# Patient Record
Sex: Male | Born: 1960 | Race: Black or African American | Hispanic: No | Marital: Married | Smoking: Current some day smoker
Health system: Southern US, Community
[De-identification: ages and names within clinical notes are randomized; demographics above are authoritative.]

## PROBLEM LIST (undated history)

## (undated) DIAGNOSIS — M1990 Unspecified osteoarthritis, unspecified site: Secondary | ICD-10-CM

## (undated) DIAGNOSIS — R760 Raised antibody titer: Secondary | ICD-10-CM

## (undated) DIAGNOSIS — R002 Palpitations: Secondary | ICD-10-CM

## (undated) DIAGNOSIS — R791 Abnormal coagulation profile: Secondary | ICD-10-CM

## (undated) HISTORY — DX: Palpitations: R00.2

## (undated) HISTORY — DX: Unspecified osteoarthritis, unspecified site: M19.90

## (undated) HISTORY — PX: OTHER SURGICAL HISTORY: SHX169

## (undated) HISTORY — DX: Raised antibody titer: R76.0

## (undated) HISTORY — DX: Abnormal coagulation profile: R79.1

---

## 2000-09-20 ENCOUNTER — Emergency Department (HOSPITAL_COMMUNITY): Admission: EM | Admit: 2000-09-20 | Discharge: 2000-09-20 | Payer: Self-pay | Admitting: Emergency Medicine

## 2000-09-20 ENCOUNTER — Encounter: Payer: Self-pay | Admitting: Emergency Medicine

## 2003-08-05 ENCOUNTER — Emergency Department (HOSPITAL_COMMUNITY): Admission: EM | Admit: 2003-08-05 | Discharge: 2003-08-05 | Payer: Self-pay | Admitting: *Deleted

## 2005-07-07 ENCOUNTER — Emergency Department (HOSPITAL_COMMUNITY): Admission: EM | Admit: 2005-07-07 | Discharge: 2005-07-07 | Payer: Self-pay | Admitting: Family Medicine

## 2005-08-12 ENCOUNTER — Emergency Department (HOSPITAL_COMMUNITY): Admission: EM | Admit: 2005-08-12 | Discharge: 2005-08-12 | Payer: Self-pay | Admitting: Family Medicine

## 2006-01-30 ENCOUNTER — Ambulatory Visit: Payer: Self-pay | Admitting: Internal Medicine

## 2006-01-31 ENCOUNTER — Ambulatory Visit: Payer: Self-pay | Admitting: Internal Medicine

## 2006-06-30 ENCOUNTER — Ambulatory Visit: Payer: Self-pay | Admitting: Internal Medicine

## 2006-12-04 ENCOUNTER — Ambulatory Visit: Payer: Self-pay | Admitting: Internal Medicine

## 2006-12-04 LAB — CONVERTED CEMR LAB
BUN: 11 mg/dL (ref 6–23)
CO2: 28 meq/L (ref 19–32)
Calcium: 9.4 mg/dL (ref 8.4–10.5)
Chloride: 102 meq/L (ref 96–112)
Creatinine, Ser: 1 mg/dL (ref 0.4–1.5)
GFR calc Af Amer: 103 mL/min
GFR calc non Af Amer: 85 mL/min
Glucose, Bld: 100 mg/dL — ABNORMAL HIGH (ref 70–99)
Hgb A1c MFr Bld: 6.1 % — ABNORMAL HIGH (ref 4.6–6.0)
Potassium: 4 meq/L (ref 3.5–5.1)
Sodium: 138 meq/L (ref 135–145)

## 2007-02-05 ENCOUNTER — Ambulatory Visit: Payer: Self-pay | Admitting: Internal Medicine

## 2008-06-16 ENCOUNTER — Encounter: Payer: Self-pay | Admitting: Internal Medicine

## 2008-06-23 ENCOUNTER — Ambulatory Visit: Payer: Self-pay | Admitting: Hematology

## 2008-06-23 ENCOUNTER — Encounter: Payer: Self-pay | Admitting: Internal Medicine

## 2008-06-23 LAB — PROTHROMBIN TIME
INR: 0.8 (ref 0.0–1.5)
Prothrombin Time: 11.6 seconds (ref 11.6–15.2)

## 2008-06-26 LAB — MIXING STUDY DILUTIONS, PTT
Patient 1/1 Immediate Mix: 54 seconds
Patient 4/1 Immediate Mix: 58 seconds

## 2008-06-26 LAB — COMPREHENSIVE METABOLIC PANEL
ALT: 22 U/L (ref 0–53)
AST: 17 U/L (ref 0–37)
BUN: 14 mg/dL (ref 6–23)
Calcium: 10.3 mg/dL (ref 8.4–10.5)
Chloride: 105 mEq/L (ref 96–112)
Creatinine, Ser: 1.25 mg/dL (ref 0.40–1.50)
Total Bilirubin: 1.1 mg/dL (ref 0.3–1.2)

## 2008-06-26 LAB — LUPUS ANTICOAGULANT PANEL
DRVVT 1:1 Mix: 38.2 secs (ref 36.1–47.0)
DRVVT: 56.5 secs — ABNORMAL HIGH (ref 36.1–47.0)
PTTLA Confirmation: 20.9 secs — ABNORMAL HIGH (ref <8.0)

## 2008-06-26 LAB — CARDIOLIPIN ANTIBODIES, IGG, IGM, IGA: Anticardiolipin IgG: <7 [GPL'U] (ref <11)

## 2008-06-26 LAB — BETA-2 GLYCOPROTEIN ANTIBODIES
Beta-2-Glycoprotein I IgA: <4 U/mL (ref <10)
Beta-2-Glycoprotein I IgM: <4 U/mL (ref <10)

## 2008-07-03 ENCOUNTER — Telehealth: Payer: Self-pay | Admitting: Internal Medicine

## 2008-07-09 ENCOUNTER — Telehealth: Payer: Self-pay | Admitting: Internal Medicine

## 2008-07-09 ENCOUNTER — Encounter: Payer: Self-pay | Admitting: Internal Medicine

## 2008-07-09 DIAGNOSIS — I2510 Atherosclerotic heart disease of native coronary artery without angina pectoris: Secondary | ICD-10-CM | POA: Insufficient documentation

## 2008-07-09 DIAGNOSIS — F17200 Nicotine dependence, unspecified, uncomplicated: Secondary | ICD-10-CM | POA: Insufficient documentation

## 2008-07-09 DIAGNOSIS — D6859 Other primary thrombophilia: Secondary | ICD-10-CM | POA: Insufficient documentation

## 2008-07-30 ENCOUNTER — Encounter: Payer: Self-pay | Admitting: Internal Medicine

## 2008-08-12 ENCOUNTER — Ambulatory Visit: Payer: Self-pay | Admitting: Internal Medicine

## 2008-11-10 ENCOUNTER — Ambulatory Visit: Payer: Self-pay | Admitting: Internal Medicine

## 2008-11-10 LAB — CONVERTED CEMR LAB
ALT: 22 units/L (ref 0–53)
AST: 21 units/L (ref 0–37)
Albumin: 3.9 g/dL (ref 3.5–5.2)
Alkaline Phosphatase: 50 units/L (ref 39–117)
BUN: 12 mg/dL (ref 6–23)
Bilirubin, Direct: 0.1 mg/dL (ref 0.0–0.3)
CO2: 31 meq/L (ref 19–32)
CRP, High Sensitivity: <1 — ABNORMAL LOW (ref 0.00–5.00)
Calcium: 9.4 mg/dL (ref 8.4–10.5)
Chloride: 108 meq/L (ref 96–112)
Creatinine, Ser: 1.2 mg/dL (ref 0.4–1.5)
GFR calc Af Amer: 83 mL/min
GFR calc non Af Amer: 68 mL/min
Glucose, Bld: 120 mg/dL — ABNORMAL HIGH (ref 70–99)
Potassium: 4.1 meq/L (ref 3.5–5.1)
Sodium: 143 meq/L (ref 135–145)
Total Bilirubin: 1.3 mg/dL — ABNORMAL HIGH (ref 0.3–1.2)
Total Protein: 7 g/dL (ref 6.0–8.3)

## 2008-12-16 ENCOUNTER — Ambulatory Visit: Payer: Self-pay | Admitting: Internal Medicine

## 2009-01-01 ENCOUNTER — Telehealth: Payer: Self-pay | Admitting: Internal Medicine

## 2009-01-09 ENCOUNTER — Ambulatory Visit: Payer: Self-pay | Admitting: Internal Medicine

## 2009-01-19 ENCOUNTER — Telehealth: Payer: Self-pay | Admitting: Internal Medicine

## 2009-03-09 ENCOUNTER — Ambulatory Visit: Payer: Self-pay | Admitting: Oncology

## 2009-05-08 ENCOUNTER — Ambulatory Visit: Payer: Self-pay | Admitting: Internal Medicine

## 2009-05-15 ENCOUNTER — Ambulatory Visit: Payer: Self-pay | Admitting: Internal Medicine

## 2009-05-15 LAB — CONVERTED CEMR LAB
Cholesterol: 209 mg/dL — ABNORMAL HIGH (ref 0–200)
Direct LDL: 101.8 mg/dL
HDL: 84.9 mg/dL (ref >39.00)
Hgb A1c MFr Bld: 5.8 % (ref 4.6–6.5)
PSA: 0.41 ng/mL (ref 0.10–4.00)
Total CHOL/HDL Ratio: 2
Triglycerides: 100 mg/dL (ref 0.0–149.0)
VLDL: 20 mg/dL (ref 0.0–40.0)

## 2009-05-15 LAB — HM COLONOSCOPY

## 2009-06-18 ENCOUNTER — Ambulatory Visit: Payer: Self-pay | Admitting: Internal Medicine

## 2009-06-18 DIAGNOSIS — R7309 Other abnormal glucose: Secondary | ICD-10-CM | POA: Insufficient documentation

## 2009-11-18 ENCOUNTER — Telehealth: Payer: Self-pay | Admitting: Internal Medicine

## 2010-10-27 ENCOUNTER — Telehealth: Payer: Self-pay | Admitting: Internal Medicine

## 2010-11-23 NOTE — Progress Notes (Signed)
Summary: Copy of lab & office visit  Phone Note Call from Patient Call back at Home Phone [phone]   Caller: Patient Summary of Call: patient called and left voice message requesting a copy of his lab work and physical. His message states that he is in the process of completing an online assessment for his insurance and need numbers from the lab work and his vital signs taken.  Copy of lab work and office visit was mailed to patient address on file, patient has been informed Initial call taken by: Glendell Docker CMA,  November 18, 2009 2:58 PM

## 2010-11-24 ENCOUNTER — Telehealth: Payer: Self-pay | Admitting: Internal Medicine

## 2010-11-25 NOTE — Progress Notes (Signed)
Summary: Chantix Denial  Phone Note Refill Request Message from:  Pharmacy on October 27, 2010 11:45 AM  Refills Requested: Medication #1:  chantix   Brand Name Necessary? Yes   Supply Requested: [facility] month [facility] [address] [place] [address] fax [phone]   Method Requested: Electronic Next Appointment Scheduled: 12-07-10 Dr Artist Pais  Initial call taken by: Roselle Locus,  October 27, 2010 11:46 AM  Follow-up for Phone Call        Rx denied because patient was last seen on August of 2010. Call was placed to pharmacy at  [phone], spoke with Cherylann Ratel she was advised rx has been denied, patient will need office visit.  Follow-up by: Glendell Docker CMA,  October 27, 2010 12:20 PM

## 2010-11-26 ENCOUNTER — Ambulatory Visit (INDEPENDENT_AMBULATORY_CARE_PROVIDER_SITE_OTHER): Payer: Federal, State, Local not specified - PPO | Admitting: Internal Medicine

## 2010-11-26 ENCOUNTER — Encounter: Payer: Self-pay | Admitting: Internal Medicine

## 2010-11-26 DIAGNOSIS — L309 Dermatitis, unspecified: Secondary | ICD-10-CM | POA: Insufficient documentation

## 2010-11-26 DIAGNOSIS — L738 Other specified follicular disorders: Secondary | ICD-10-CM

## 2010-11-29 ENCOUNTER — Telehealth: Payer: Self-pay | Admitting: Internal Medicine

## 2010-12-01 NOTE — Progress Notes (Signed)
Summary: Physical Blood Work  Phone Note Call from Patient   Caller: Patient Call For: darlene Summary of Call: pt has scheduled CPE on 02.16.12. pt did not have any labs last year. Initial call taken by: Elba Barman,  November 24, 2010 12:02 PM  Follow-up for Phone Call        BMP prior to visit, ICD-9:  790.29 Lipid Panel prior to visit, ICD-9: 790.29 HbgA1C prior to visit, ICD-9: 790.29 High sensitivity CRP :  272.4  Follow-up by: D. Thomos Lemons DO,  November 24, 2010 9:11 PM  Additional Follow-up for Phone Call Additional follow up Details #1::        Phone Call Completed, lab orders entered for the week of February 16th Additional Follow-up by: Glendell Docker CMA,  November 25, 2010 8:45 AM

## 2010-12-02 ENCOUNTER — Other Ambulatory Visit: Payer: Self-pay | Admitting: Internal Medicine

## 2010-12-02 ENCOUNTER — Other Ambulatory Visit: Payer: Federal, State, Local not specified - PPO

## 2010-12-02 ENCOUNTER — Encounter: Payer: Self-pay | Admitting: Internal Medicine

## 2010-12-02 ENCOUNTER — Encounter (INDEPENDENT_AMBULATORY_CARE_PROVIDER_SITE_OTHER): Payer: Self-pay | Admitting: *Deleted

## 2010-12-02 DIAGNOSIS — E785 Hyperlipidemia, unspecified: Secondary | ICD-10-CM

## 2010-12-02 DIAGNOSIS — R7309 Other abnormal glucose: Secondary | ICD-10-CM

## 2010-12-02 DIAGNOSIS — Z125 Encounter for screening for malignant neoplasm of prostate: Secondary | ICD-10-CM

## 2010-12-02 DIAGNOSIS — Z0000 Encounter for general adult medical examination without abnormal findings: Secondary | ICD-10-CM

## 2010-12-02 LAB — URINALYSIS
Bilirubin Urine: NEGATIVE
Ketones, ur: NEGATIVE
Leukocytes, UA: NEGATIVE
Urobilinogen, UA: 0.2 (ref 0.0–1.0)

## 2010-12-02 LAB — CBC WITH DIFFERENTIAL/PLATELET
Basophils Absolute: 0.1 10*3/uL (ref 0.0–0.1)
Basophils Relative: 0.6 % (ref 0.0–3.0)
Eosinophils Absolute: 0.6 10*3/uL (ref 0.0–0.7)
Eosinophils Relative: 6.2 % — ABNORMAL HIGH (ref 0.0–5.0)
HCT: 40.4 % (ref 39.0–52.0)
Hemoglobin: 13.9 g/dL (ref 13.0–17.0)
Lymphocytes Relative: 23.3 % (ref 12.0–46.0)
Lymphs Abs: 2.1 10*3/uL (ref 0.7–4.0)
MCHC: 34.5 g/dL (ref 30.0–36.0)
MCV: 95.7 fl (ref 78.0–100.0)
Monocytes Absolute: 0.9 10*3/uL (ref 0.1–1.0)
Monocytes Relative: 10.2 % (ref 3.0–12.0)
Neutro Abs: 5.3 10*3/uL (ref 1.4–7.7)
Neutrophils Relative %: 59.7 % (ref 43.0–77.0)
Platelets: 385 10*3/uL (ref 150.0–400.0)
RBC: 4.22 Mil/uL (ref 4.22–5.81)
RDW: 13.6 % (ref 11.5–14.6)
WBC: 8.9 10*3/uL (ref 4.5–10.5)

## 2010-12-02 LAB — BASIC METABOLIC PANEL
BUN: 19 mg/dL (ref 6–23)
CO2: 27 mEq/L (ref 19–32)
Calcium: 9.2 mg/dL (ref 8.4–10.5)
Chloride: 103 mEq/L (ref 96–112)
Creatinine, Ser: 1 mg/dL (ref 0.4–1.5)
GFR: 97.53 mL/min (ref >60.00)
Glucose, Bld: 101 mg/dL — ABNORMAL HIGH (ref 70–99)
Potassium: 4.5 mEq/L (ref 3.5–5.1)
Sodium: 138 mEq/L (ref 135–145)

## 2010-12-02 LAB — LIPID PANEL
Cholesterol: 204 mg/dL — ABNORMAL HIGH (ref 0–200)
HDL: 76.8 mg/dL (ref >39.00)
Total CHOL/HDL Ratio: 3
Triglycerides: 62 mg/dL (ref 0.0–149.0)
VLDL: 12.4 mg/dL (ref 0.0–40.0)

## 2010-12-02 LAB — HEPATIC FUNCTION PANEL
ALT: 34 U/L (ref 0–53)
AST: 23 U/L (ref 0–37)
Albumin: 4 g/dL (ref 3.5–5.2)
Total Protein: 6.9 g/dL (ref 6.0–8.3)

## 2010-12-02 LAB — TSH: TSH: 0.42 u[IU]/mL (ref 0.35–5.50)

## 2010-12-07 ENCOUNTER — Encounter: Payer: Self-pay | Admitting: Internal Medicine

## 2010-12-09 ENCOUNTER — Encounter: Payer: Self-pay | Admitting: Internal Medicine

## 2010-12-09 ENCOUNTER — Telehealth: Payer: Self-pay | Admitting: Internal Medicine

## 2010-12-09 ENCOUNTER — Other Ambulatory Visit: Payer: Self-pay | Admitting: Internal Medicine

## 2010-12-09 ENCOUNTER — Encounter (INDEPENDENT_AMBULATORY_CARE_PROVIDER_SITE_OTHER): Payer: Federal, State, Local not specified - PPO | Admitting: Internal Medicine

## 2010-12-09 DIAGNOSIS — Z0000 Encounter for general adult medical examination without abnormal findings: Secondary | ICD-10-CM

## 2010-12-09 NOTE — Progress Notes (Signed)
Summary: Nicorette Rx  Phone Note Call from Patient   Caller: Patient Details for Reason: RX Summary of Call: Pt has flex spending  with employer, needs rx for nictroderm patches sent to pharmacy for ins to pay  [employer] [facility]  Initial call taken by: Darral Dash,  November 29, 2010 11:02 AM  Follow-up for Phone Call        I suggest pt use nicotine lozenges see rx Follow-up by: D. Thomos Lemons DO,  November 29, 2010 2:41 PM  Additional Follow-up for Phone Call Additional follow up Details #1::        call returned to patient  at [phone], he has been advised per Dr Artist Pais instructions. Additional Follow-up by: Glendell Docker CMA,  November 30, 2010 9:56 AM    New/Updated Medications: NICORETTE 2 MG LOZG (NICOTINE POLACRILEX) use as directed Prescriptions: NICORETTE 2 MG LOZG (NICOTINE POLACRILEX) use as directed  #90 x 1   Entered and Authorized by:   D. Thomos Lemons DO   Signed by:   D. Thomos Lemons DO on 11/29/2010   Method used:   Electronically to        [id number] (retail)       [address]       [place], [place]  [address]       Ph: [phone] or [phone]       Fax: [phone]   RxID:   [id number]

## 2010-12-14 ENCOUNTER — Telehealth: Payer: Self-pay | Admitting: Internal Medicine

## 2010-12-15 NOTE — Assessment & Plan Note (Signed)
Summary: bumps on face and neck/mhf   Vital Signs:  Patient profile:   50 year old male Height:      66.5 inches Weight:      174 pounds BMI:     27.76 O2 Sat:      98 % on Room air Temp:     98.9 degrees F oral Pulse rate:   87 / minute Resp:     18 per minute BP sitting:   130 / 80  (right arm) Cuff size:   large  Vitals Entered By: Glendell Docker CMA (November 26, 2010 4:06 PM)  O2 Flow:  Room air CC: Rash on face Is Patient Diabetic? No Pain Assessment Patient in pain? no      Comments onset over  2 weeks sgo, bumps on face that have not developed pus, discuss Chantix   Primary Care Provider:  Dondra Spry DO  CC:  Rash on face.  History of Present Illness:  Rash      This is a 50 year old man who presents with Rash.  The patient reports pustules.  The rash is located on the face.  symptoms occurred after shaving.  denies fever or chills.  denies hx of previous carbuncles.    Preventive Screening-Counseling & Management  Alcohol-Tobacco     Smoking Status: current  Allergies (verified): No Known Drug Allergies  Past History:  Past Medical History: Tobacco abuse  Abnormal glucose  Palpitation 05/2008 Abnormal myoview - anterlolateral ischemia Negative cardiac cath - 06/2008  Dr. Allyson Sabal Abnormal PTT  Positive lupus anticoagulant but neg for antiphosolipid antibody  and anti B2 glycoprotein antibody Dr. Gaylyn Rong   Family History: Hypertension - mother, father CAD - no Colon ca - no Prostate ca - no    Physical Exam  General:  alert, well-developed, and well-nourished.   Lungs:  normal respiratory effort and normal breath sounds.   Heart:  normal rate, regular rhythm, and no gallop.   Skin:  multiple subcentimeter pimple like lesions on face and chin.  no drainage.     Impression & Recommendations:  Problem # 1:  FOLLICULITIS (ICD-704.8) use new razor disinfection razor if he going to reuse tx with doxy Patient advised to call office if  symptoms persist or worsen.  Complete Medication List: 1)  Doxycycline Hyclate 100 Mg Tabs (Doxycycline hyclate) .... One by mouth two times a day 2)  Nicorette 2 Mg Lozg (Nicotine polacrilex) .... Use as directed  Other Orders: T-Basic Metabolic Panel [id number]) T-Hepatic Function [phone]) T-Lipid Profile [phone]) CRP, high sensitivity-[facility] [phone]) T- Hemoglobin A1C (28413-24401) T-PSA (02725-36644) T-TSH (03474-25956)  Patient Instructions: 1)  Call our office if your symptoms do not  improve or gets worse. Prescriptions: DOXYCYCLINE HYCLATE 100 MG TABS (DOXYCYCLINE HYCLATE) one by mouth two times a day  #20 x 0   Entered and Authorized by:   D. Thomos Lemons DO   Signed by:   D. Thomos Lemons DO on 11/26/2010   Method used:   Electronically to        [id number]       [address]       [place], [place]  [address]       Ph: [phone] or [phone]       Fax: [phone]   RxID:   [id number]    Orders Added: 1)  T-Basic Metabolic Panel [phone] 2)  T-Hepatic Function [id number] 3)  T-Lipid Profile [80061-22930] 4)  CRP, high sensitivity-[facility] [  [address]] 5)  T- Hemoglobin A1C [83036-23375] 6)  T-PSA [09811-91478] 7)  T-TSH [29562-13086] 8)  Est. Patient Level III [[address]]   Immunization History:  Influenza Immunization History:    Influenza:  delcined (11/26/2010)   Immunization History:  Influenza Immunization History:    Influenza:  Delcined (11/26/2010)   Current Allergies (reviewed today): No known allergies

## 2010-12-15 NOTE — Progress Notes (Signed)
Summary: call pt with rx status  Phone Note Call from Patient Call back at Home Phone [phone]   Caller: Patient Call For: Travis Rice Summary of Call: Pt would like [place] to call him when rx for face cream has been sent to his pharmacy. Dr Artist Pais is aware to send rx. Initial call taken by: Mervin Kung CMA Duncan Dull),  December 09, 2010 11:01 AM  Follow-up for Phone Call        call placed to patient [phone], patient informed rx sent to pharmacy Follow-up by: Travis Rice CMA,  December 09, 2010 4:02 PM

## 2010-12-21 NOTE — Progress Notes (Signed)
Summary: Pathology Results  Phone Note Outgoing Call   Summary of Call: call pt - pathology reports shows lesions are benign Initial call taken by: D. Thomos Lemons DO,  December 14, 2010 6:28 PM  Follow-up for Phone Call        call placed to patient at [phone], he has been informed per Dr Artist Pais instructions Follow-up by: Glendell Docker CMA,  December 15, 2010 8:29 AM

## 2010-12-30 NOTE — Assessment & Plan Note (Signed)
Summary: cpx/ss   Vital Signs:  Patient profile:   50 year old male Height:      65.5 inches Weight:      176.50 pounds BMI:     29.03 O2 Sat:      98 % on Room air Temp:     97.8 degrees F oral Pulse rate:   78 / minute Resp:     18 per minute BP sitting:   114 / 72  (right arm) Cuff size:   large  Vitals Entered By: Glendell Docker CMA (December 09, 2010 8:42 AM)  O2 Flow:  Room air  Primary Care Provider:  D. 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 Allyson Sabal Abnormal PTT  Positive lupus anticoagulant but neg for antiphosolipid antibody  and anti B2 glycoprotein antibody Dr. 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  The patient denies anorexia, fever, chest pain, syncope, dyspnea on exertion, hemoptysis, melena, hematochezia, severe indigestion/heartburn, genital sores, and depression.     Physical Exam  General:  alert, well-developed, and well-nourished.   Head:  normocephalic and atraumatic.   Eyes:  pupils equal, pupils round, and pupils reactive to light.   Mouth:  upper dental plate Neck:  supple and no masses.   Lungs:  normal respiratory effort and normal breath sounds.   Heart:  normal rate, regular rhythm, and no gallop.   Abdomen:  soft, non-tender, normal bowel sounds, no masses, no hepatomegaly, and no splenomegaly.   Rectal:  normal sphincter tone and no masses.   Prostate:  no gland enlargement, no nodules, and no asymmetry.     Impression & Recommendations:  Problem # 1:  HEALTH MAINTENANCE EXAM (ICD-V70.0)  Reviewed adult health maintenance protocols. Pt counseled on diet and exercise.   Orders: EKG w/ Interpretation ([address])  Colonoscopy: Location:  [facility].   (05/15/2009) Td Booster: given (03/12/2001)   Flu Vax: Delcined (11/26/2010)   Chol: 204 (12/02/2010)   HDL: 76.80 (12/02/2010)   TG: 62.0 (12/02/2010) TSH: 0.42 (12/02/2010)   HgbA1C: 6.2 (12/02/2010)   PSA: 0.27 (12/02/2010) Next Colonoscopy due:: 05/2019 (05/15/2009)  Complete Medication List: 1)  Nicorette 2 Mg Lozg (Nicotine polacrilex) .... Use as directed 2)  Metrogel 1 % Gel (Metronidazole) .... Use once daily x 2 weeks  Patient Instructions: 1)  Please schedule a follow-up appointment  in 1 year. 2)  Please schedule a follow-up appointment as needed. Prescriptions: METROGEL 1 % GEL (METRONIDAZOLE) use once daily x 2 weeks  #1 month x 1   Entered and Authorized by:   D. Thomos Lemons DO   Signed by:   D. Thomos Lemons DO on 12/09/2010   Method used:   Electronically to        [id number]       [address]       [place], [place]  [address]       Ph: [phone] or [phone]       Fax: [phone]   RxID:   [id number]    Orders Added: 1)  EKG w/ Interpretation [[address]] 2)  New Patient 40-64 years [[address]]    Current Allergies  (reviewed today): No known allergies

## 2011-08-09 ENCOUNTER — Inpatient Hospital Stay (INDEPENDENT_AMBULATORY_CARE_PROVIDER_SITE_OTHER)
Admission: RE | Admit: 2011-08-09 | Discharge: 2011-08-09 | Disposition: A | Discharge status: Home / Self Care | Payer: Federal, State, Local not specified - PPO | Source: Ambulatory Visit | Attending: Family Medicine | Admitting: Family Medicine

## 2011-08-09 DIAGNOSIS — H6980 Other specified disorders of Eustachian tube, unspecified ear: Secondary | ICD-10-CM

## 2011-08-09 DIAGNOSIS — R51 Headache: Secondary | ICD-10-CM

## 2011-11-28 ENCOUNTER — Encounter: Payer: Self-pay | Admitting: Family

## 2011-11-28 ENCOUNTER — Ambulatory Visit (INDEPENDENT_AMBULATORY_CARE_PROVIDER_SITE_OTHER): Payer: Federal, State, Local not specified - PPO | Admitting: Family

## 2011-11-28 VITALS — BP 136/90 | Temp 98.4°F | Ht 66.0 in | Wt 184.0 lb

## 2011-11-28 DIAGNOSIS — L739 Follicular disorder, unspecified: Secondary | ICD-10-CM

## 2011-11-28 DIAGNOSIS — L259 Unspecified contact dermatitis, unspecified cause: Secondary | ICD-10-CM

## 2011-11-28 DIAGNOSIS — L738 Other specified follicular disorders: Secondary | ICD-10-CM

## 2011-11-28 MED ORDER — DOXYCYCLINE HYCLATE 100 MG PO TABS: 100.0000 mg | ORAL_TABLET | Freq: Two times a day (BID) | ORAL | Status: DC

## 2011-11-28 MED ORDER — PREDNISONE 20 MG PO TABS: 60.0000 mg | ORAL_TABLET | Freq: Every day | ORAL | Status: DC

## 2011-11-28 NOTE — Progress Notes (Signed)
  Subjective:    Patient ID: Travis Rice, male    DOB: 1961/01/09, 51 y.o.   MRN: [id number]  Rash This is a new problem. The current episode started in the past 7 days. The problem has been gradually worsening since onset. The rash is diffuse. The rash is characterized by redness, dryness and itchiness. He was exposed to a new detergent/soap (protien shakes). Pertinent negatives include no cough, fever, joint pain, shortness of breath or sore throat. Past treatments include nothing.      Review of Systems  Constitutional: Negative.  Negative for fever.  HENT: Negative for sore throat.   Respiratory: Negative.  Negative for cough and shortness of breath.   Cardiovascular: Negative.   Musculoskeletal: Negative for joint pain.  Skin: Positive for rash.       All over body and pustule on left foot.   Hematological: Negative.   Psychiatric/Behavioral: Negative.        Objective:   Physical Exam  Constitutional: He is oriented to person, place, and time. He appears well-developed and well-nourished.  Neck: Normal range of motion. Neck supple.  Cardiovascular: Normal rate, regular rhythm and normal heart sounds.   Pulmonary/Chest: Effort normal and breath sounds normal.  Neurological: He is alert and oriented to person, place, and time.  Skin: Skin is warm and dry. Rash noted.       Dry, red excoriated rash noted over the body, patchy, and inflamed. Left foot, a pustule formation noted to the dorsal aspect of the left foot.  Psychiatric: He has a normal mood and affect.          Assessment & Plan:

## 2011-11-28 NOTE — Patient Instructions (Signed)
Contact Dermatitis Contact dermatitis is a reaction to certain substances that touch the skin. Contact dermatitis can be either irritant contact dermatitis or allergic contact dermatitis. Irritant contact dermatitis does not require previous exposure to the substance for a reaction to occur. Allergic contact dermatitis only occurs if you have been exposed to the substance before. Upon a repeat exposure, your body reacts to the substance.   CAUSES   Many substances can cause contact dermatitis. Irritant dermatitis is most commonly caused by repeated exposure to mildly irritating substances, such as:  Makeup.     Soaps.    Detergents.    Bleaches.    Acids.    Metal salts, such as nickel.  Allergic contact dermatitis is most commonly caused by exposure to:  Poisonous plants.     Chemicals (deodorants, shampoos).     Jewelry.    Latex.    Neomycin in triple antibiotic cream.     Preservatives in products, including clothing.  SYMPTOMS   The area of skin that is exposed may develop:  Dryness or flaking.     Redness.    Cracks.    Itching.    Pain or a burning sensation.     Blisters.  With allergic contact dermatitis, there may also be swelling in areas such as the eyelids, mouth, or genitals.   DIAGNOSIS   Your caregiver can usually tell what the problem is by doing a physical exam. In cases where the cause is uncertain and an allergic contact dermatitis is suspected, a patch skin test may be performed to help determine the cause of your dermatitis. TREATMENT Treatment includes protecting the skin from further contact with the irritating substance by avoiding that substance if possible. Barrier creams, powders, and gloves may be helpful. Your caregiver may also recommend:  Steroid creams or ointments applied 2 times daily. For best results, soak the rash area in cool water for 20 minutes. Then apply the medicine. Cover the area with a plastic wrap. You can store the  steroid cream in the refrigerator for a "chilly" effect on your rash. That may decrease itching. Oral steroid medicines may be needed in more severe cases.     Antibiotics or antibacterial ointments if a skin infection is present.     Antihistamine lotion or an antihistamine taken by mouth to ease itching.     Lubricants to keep moisture in your skin.     Burow's solution to reduce redness and soreness or to dry a weeping rash. Mix one packet or tablet of solution in 2 cups cool water. Dip a clean washcloth in the mixture, wring it out a bit, and put it on the affected area. Leave the cloth in place for 30 minutes. Do this as often as possible throughout the day.     Taking several cornstarch or baking soda baths daily if the area is too large to cover with a washcloth.  Harsh chemicals, such as alkalis or acids, can cause skin damage that is like a burn. You should flush your skin for 15 to 20 minutes with cold water after such an exposure. You should also seek immediate medical care after exposure. Bandages (dressings), antibiotics, and pain medicine may be needed for severely irritated skin.   HOME CARE INSTRUCTIONS  Avoid the substance that caused your reaction.     Keep the area of skin that is affected away from hot water, soap, sunlight, chemicals, acidic substances, or anything else that would irritate your skin.       Do not scratch the rash. Scratching may cause the rash to become infected.     You may take cool baths to help stop the itching.     Only take over-the-counter or prescription medicines as directed by your caregiver.     See your caregiver for follow-up care as directed to make sure your skin is healing properly.  SEEK MEDICAL CARE IF:    Your condition is not better after 3 days of treatment.     You seem to be getting worse.     You see signs of infection such as swelling, tenderness, redness, soreness, or warmth in the affected area.     You have any problems  related to your medicines.  Document Released: 10/07/2000 Document Revised: 06/22/2011 Document Reviewed: 03/15/2011 Exit[employer] Patient Information 2012 [employer]. 

## 2011-12-01 LAB — WOUND CULTURE: Organism ID, Bacteria: NO GROWTH

## 2011-12-02 ENCOUNTER — Other Ambulatory Visit (INDEPENDENT_AMBULATORY_CARE_PROVIDER_SITE_OTHER): Payer: Federal, State, Local not specified - PPO

## 2011-12-02 DIAGNOSIS — L259 Unspecified contact dermatitis, unspecified cause: Secondary | ICD-10-CM

## 2011-12-02 DIAGNOSIS — Z0000 Encounter for general adult medical examination without abnormal findings: Secondary | ICD-10-CM

## 2011-12-02 LAB — CBC WITH DIFFERENTIAL/PLATELET
Basophils Absolute: 0 10*3/uL (ref 0.0–0.1)
Basophils Relative: 0.2 % (ref 0.0–3.0)
Hemoglobin: 13.9 g/dL (ref 13.0–17.0)
Lymphocytes Relative: 31.3 % (ref 12.0–46.0)
Monocytes Relative: 10.4 % (ref 3.0–12.0)
Neutro Abs: 8.3 10*3/uL — ABNORMAL HIGH (ref 1.4–7.7)
RBC: 4.29 Mil/uL (ref 4.22–5.81)

## 2011-12-02 LAB — HEPATIC FUNCTION PANEL
AST: 19 U/L (ref 0–37)
Albumin: 4 g/dL (ref 3.5–5.2)
Alkaline Phosphatase: 53 U/L (ref 39–117)
Bilirubin, Direct: 0.1 mg/dL (ref 0.0–0.3)
Total Protein: 7.1 g/dL (ref 6.0–8.3)

## 2011-12-02 LAB — POCT URINALYSIS DIPSTICK
Glucose, UA: NEGATIVE
Leukocytes, UA: NEGATIVE
Spec Grav, UA: 1.02
Urobilinogen, UA: 0.2

## 2011-12-02 LAB — BASIC METABOLIC PANEL
Calcium: 9.8 mg/dL (ref 8.4–10.5)
GFR: 82.24 mL/min (ref >60.00)
Sodium: 141 mEq/L (ref 135–145)

## 2011-12-02 LAB — LIPID PANEL: VLDL: 29.8 mg/dL (ref 0.0–40.0)

## 2011-12-02 LAB — TSH: TSH: 0.55 u[IU]/mL (ref 0.35–5.50)

## 2011-12-02 LAB — PSA: PSA: 0.3 ng/mL (ref 0.10–4.00)

## 2011-12-05 ENCOUNTER — Ambulatory Visit: Payer: Federal, State, Local not specified - PPO | Admitting: Internal Medicine

## 2011-12-07 ENCOUNTER — Ambulatory Visit (INDEPENDENT_AMBULATORY_CARE_PROVIDER_SITE_OTHER): Payer: Federal, State, Local not specified - PPO | Admitting: Internal Medicine

## 2011-12-07 ENCOUNTER — Encounter: Payer: Self-pay | Admitting: Internal Medicine

## 2011-12-07 VITALS — BP 134/92 | HR 80 | Temp 98.5°F | Ht 68.0 in | Wt 181.0 lb

## 2011-12-07 DIAGNOSIS — L309 Dermatitis, unspecified: Secondary | ICD-10-CM

## 2011-12-07 DIAGNOSIS — Z0000 Encounter for general adult medical examination without abnormal findings: Secondary | ICD-10-CM

## 2011-12-07 DIAGNOSIS — L259 Unspecified contact dermatitis, unspecified cause: Secondary | ICD-10-CM

## 2011-12-07 MED ORDER — DOXYCYCLINE HYCLATE 100 MG PO TABS: 100.0000 mg | ORAL_TABLET | Freq: Two times a day (BID) | ORAL | Status: AC

## 2011-12-07 NOTE — Assessment & Plan Note (Addendum)
Diffuse dermatitis of unclear etiology.  Minimal response to prednisone and doxycycline.  He has rash on palms.  I doubt RMSF.  Refer to Dermatology for further evaluation.  Stop prednisone.  Empiric treatment with additional 7 days of doxycycline. Add RPR to recent labs.

## 2011-12-07 NOTE — Patient Instructions (Signed)
Our office will contact you re: referral to dermatologist 

## 2011-12-07 NOTE — Assessment & Plan Note (Signed)
Reviewed adult health maintenance protocols. I encouraged weight loss and tobacco cessation.  Patient up to date with colonoscopy.  PSA is normal.  He defers DRE until next CPX.

## 2011-12-07 NOTE — Progress Notes (Signed)
  Subjective:    Patient ID: Travis Rice, male    DOB: [birth year], 51 y.o.   MRN: [id number]  HPI  51 year old male with history of tobacco use and mild hypoglycemia routine physical. Patient recently seen by nurse practitioner for unexplained rash that started in early February. He is not able to recall any potential triggers such as new foods, medications or topical agents. He reports rash started near his left ankle that started spreading to the rest of his body.  He was treated with doxycycline and prednisone by nurse practitioner. There has been minimal improvement in his rash. There is some pruritus.  He has cut back on smoking but has not been able to quit  Reviewed lab results with patient in detail.  Review of Systems   Constitutional: Negative for fever, appetite change and unexpected weight change.  Eyes: Negative for visual disturbance.  Respiratory: Negative for cough, chest tightness and shortness of breath.   Cardiovascular: Negative for chest pain.  Genitourinary: Negative for difficulty urinating.  Neurological: Negative for headaches.  Gastrointestinal: Negative for abdominal pain, heartburn melena or hematochezia  Past Medical History  Diagnosis Date  . Abnormal glucose   . Palpitations   . Abnormal partial thromboplastin time (PTT)   . Lupus anticoagulant positive     but negative for antiphosolipid antibody and anti B2 glycoprotein antibody    History   Social History  . Marital Status: Divorced    Spouse Name: N/A    Number of Children: N/A  . Years of Education: N/A   Occupational History  . Not on file.   Social History Main Topics  . Smoking status: Current Everyday Smoker  . Smokeless tobacco: Not on file  . Alcohol Use: Yes  . Drug Use: No  . Sexually Active: Not on file   Other Topics Concern  . Not on file   Social History Narrative  . No narrative on file    No past surgical history on file.  Family History  Problem Relation  Age of Onset  . Hypertension Mother   . Hypertension Father     No Known Allergies  No current outpatient prescriptions on file prior to visit.    BP 134/92  Pulse 80  Temp(Src) 98.5 F (36.9 C) (Oral)  Ht 5\' 8"  (1.727 m)  Wt 181 lb (82.101 kg)  BMI 27.52 kg/m2      Objective:   Physical Exam  Constitutional: He is oriented to person, place, and time. He appears well-developed and well-nourished.  HENT:  Head: Normocephalic and atraumatic.  Right Ear: External ear normal.  Left Ear: External ear normal.  Eyes: Conjunctivae are normal. Pupils are equal, round, and reactive to light. No scleral icterus.  Neck: Neck supple. No thyromegaly present.  Cardiovascular: Normal rate, regular rhythm, normal heart sounds and intact distal pulses.   No murmur heard. Pulmonary/Chest: Effort normal and breath sounds normal. He has no wheezes. He has no rales.  Abdominal: Soft. Bowel sounds are normal. He exhibits no distension and no mass.  Musculoskeletal: Normal range of motion.  Lymphadenopathy:    He has no cervical adenopathy.  Neurological: He is alert and oriented to person, place, and time.  Skin:       Diffuse macular rash - arms, chest, back, legs, palms and feet      Assessment & Plan:

## 2011-12-08 NOTE — Progress Notes (Signed)
Addended by: Bonnye Fava on: 12/08/2011 01:45 PM   Modules accepted: Orders

## 2011-12-20 ENCOUNTER — Other Ambulatory Visit: Payer: Self-pay | Admitting: Dermatology

## 2011-12-28 ENCOUNTER — Emergency Department (HOSPITAL_COMMUNITY)
Admission: EM | Admit: 2011-12-28 | Discharge: 2011-12-28 | Payer: Federal, State, Local not specified - PPO | Source: Home / Self Care

## 2011-12-28 ENCOUNTER — Ambulatory Visit (INDEPENDENT_AMBULATORY_CARE_PROVIDER_SITE_OTHER): Payer: Federal, State, Local not specified - PPO | Admitting: Internal Medicine

## 2011-12-28 VITALS — BP 102/76 | Temp 98.0°F | Ht 66.0 in | Wt 181.0 lb

## 2011-12-28 DIAGNOSIS — L259 Unspecified contact dermatitis, unspecified cause: Secondary | ICD-10-CM

## 2011-12-28 DIAGNOSIS — R197 Diarrhea, unspecified: Secondary | ICD-10-CM | POA: Insufficient documentation

## 2011-12-28 DIAGNOSIS — L309 Dermatitis, unspecified: Secondary | ICD-10-CM

## 2011-12-28 MED ORDER — ONDANSETRON HCL 4 MG PO TABS: 4.0000 mg | ORAL_TABLET | Freq: Three times a day (TID) | ORAL | Status: AC | PRN

## 2011-12-28 NOTE — Assessment & Plan Note (Signed)
51 year old [place] American male with probable viral gastroenteritis. Use Zofran 4 mg every 8 hours as needed for nausea. Patient advised to use Imodium and BRAT diet for diarrhea. He has recently been on antibiotics for unexplained rash. Obtain stool studies for C. difficile and stool culture. Patient understands to increase fluid intake.  Patient advised to call office if symptoms persist or worsen.

## 2011-12-28 NOTE — Patient Instructions (Signed)
Increase fluid intake as directed You can use imodium over the counter for diarrhea Please call our office if your symptoms do not improve or gets worse. Follow BRAT diet for 2-3 days (bananas, rice, applesauce, dry toast)

## 2011-12-28 NOTE — Progress Notes (Signed)
  Subjective:    Patient ID: Travis Rice, male    DOB: Sep 30, 1961, 51 y.o.   MRN: [id number]  HPI  51 year old African American male complains of nausea and diarrhea x3 days. His symptoms initially started with nausea after eating yogurt. He describes cramping sensation and diffuse watery diarrhea. He has not eaten much over the last 2 days but has been able to drink and keep down liquids. His son had similar GI illness recently.   Review of Systems Negative for dizziness, negative for fever, negative for hematochezia    Past Medical History  Diagnosis Date  . Abnormal glucose   . Palpitations   . Abnormal partial thromboplastin time (PTT)   . Lupus anticoagulant positive     but negative for antiphosolipid antibody and anti B2 glycoprotein antibody    History   Social History  . Marital Status: Divorced    Spouse Name: N/A    Number of Children: N/A  . Years of Education: N/A   Occupational History  . Not on file.   Social History Main Topics  . Smoking status: Current Everyday Smoker  . Smokeless tobacco: Not on file  . Alcohol Use: Yes  . Drug Use: No  . Sexually Active: Not on file   Other Topics Concern  . Not on file   Social History Narrative  . No narrative on file    No past surgical history on file.  Family History  Problem Relation Age of Onset  . Hypertension Mother   . Hypertension Father     No Known Allergies  No current outpatient prescriptions on file prior to visit.    BP 102/76  Temp(Src) 98 F (36.7 C) (Oral)  Ht 5\' 6"  (1.676 m)  Wt 181 lb (82.101 kg)  BMI 29.21 kg/m2    Objective:   Physical Exam  Constitutional: He appears well-developed and well-nourished.  HENT:  Head: Normocephalic and atraumatic.  Mouth/Throat: Oropharynx is clear and moist.  Neck: Neck supple.  Cardiovascular: Normal rate, regular rhythm and normal heart sounds.   Pulmonary/Chest: Effort normal and breath sounds normal. He has no wheezes. He has  no rales.  Abdominal: Soft. He exhibits no mass. There is no rebound and no guarding.       Increase in bowel sounds,  mild diffuse tenderness  Lymphadenopathy:    He has no cervical adenopathy.  Skin:       Normal skin turgor       Assessment & Plan:

## 2011-12-28 NOTE — Assessment & Plan Note (Signed)
He was seen by dermatology.  Rash resolving with steroid creams.  Biopsy performed on left upper thigh.  Obtain copy of pathology and dermatology office note.

## 2012-04-06 ENCOUNTER — Ambulatory Visit (INDEPENDENT_AMBULATORY_CARE_PROVIDER_SITE_OTHER): Payer: Federal, State, Local not specified - PPO | Admitting: Internal Medicine

## 2012-04-06 ENCOUNTER — Encounter: Payer: Self-pay | Admitting: Internal Medicine

## 2012-04-06 VITALS — BP 110/80 | Temp 98.9°F | Ht 66.0 in | Wt 178.0 lb

## 2012-04-06 DIAGNOSIS — R197 Diarrhea, unspecified: Secondary | ICD-10-CM

## 2012-04-06 NOTE — Patient Instructions (Addendum)
Use imodium over the counter as directed Avoid fatty or high protein meals.  Also avoid dairy products for a week. Increase fluid intake. Please call our office if your symptoms do not improve or gets worse.

## 2012-04-06 NOTE — Assessment & Plan Note (Signed)
Previous diarrhea symptoms resolved.   New onset diarrhea and vomiting over last 24-48 hrs.   His 53-year-old daughter had similar symptoms. He does not have any significant nausea. Patient advised to use Imodium over-the-counter as needed. Bland diet encouraged. Patient advised to call office if GI symptoms persist or worsen.    Out of work until Monday.

## 2012-04-06 NOTE — Progress Notes (Signed)
  Subjective:    Patient ID: Travis Rice, male    DOB: 06/06/1961, 51 y.o.   MRN: [id number]  HPI  51 year old African American male complains of abdominal cramps and diarrhea for 2 days. His symptoms started with mild right-sided headache. On Wednesday night he started to  have watery stools. His 53-year-old daughter had similar symptoms one week ago.  He had one episode of vomiting on Wednesday night.  He denies fever or chills. He is able to keep down liquids. He denies significant abdominal pain.  Review of Systems See HPI  Past Medical History  Diagnosis Date  . Abnormal glucose   . Palpitations   . Abnormal partial thromboplastin time (PTT)   . Lupus anticoagulant positive     but negative for antiphosolipid antibody and anti B2 glycoprotein antibody    History   Social History  . Marital Status: Divorced    Spouse Name: N/A    Number of Children: N/A  . Years of Education: N/A   Occupational History  . Not on file.   Social History Main Topics  . Smoking status: Current Everyday Smoker  . Smokeless tobacco: Not on file  . Alcohol Use: Yes  . Drug Use: No  . Sexually Active: Not on file   Other Topics Concern  . Not on file   Social History Narrative  . No narrative on file    No past surgical history on file.  Family History  Problem Relation Age of Onset  . Hypertension Mother   . Hypertension Father     No Known Allergies  No current outpatient prescriptions on file prior to visit.    BP 110/80  Temp 98.9 F (37.2 C) (Oral)  Ht 5\' 6"  (1.676 m)  Wt 178 lb (80.74 kg)  BMI 28.73 kg/m2       Objective:   Physical Exam  Constitutional: He is oriented to person, place, and time. He appears well-developed and well-nourished.  HENT:  Head: Normocephalic and atraumatic.  Right Ear: External ear normal.  Left Ear: External ear normal.  Mouth/Throat: Oropharynx is clear and moist.  Cardiovascular: Normal rate and normal heart sounds.     Pulmonary/Chest: Effort normal and breath sounds normal. He has no wheezes. He has no rales.  Abdominal: Soft. There is no rebound and no guarding.       Increased bowel sounds, mild diffuse tenderness  Neurological: He is alert and oriented to person, place, and time.  Skin: Skin is warm and dry.       Assessment & Plan:

## 2012-05-24 ENCOUNTER — Encounter: Payer: Self-pay | Admitting: Internal Medicine

## 2012-05-24 ENCOUNTER — Ambulatory Visit (INDEPENDENT_AMBULATORY_CARE_PROVIDER_SITE_OTHER): Payer: Federal, State, Local not specified - PPO | Admitting: Internal Medicine

## 2012-05-24 VITALS — BP 122/82 | HR 100 | Temp 98.4°F | Wt 180.0 lb

## 2012-05-24 DIAGNOSIS — I2510 Atherosclerotic heart disease of native coronary artery without angina pectoris: Secondary | ICD-10-CM

## 2012-05-24 DIAGNOSIS — K219 Gastro-esophageal reflux disease without esophagitis: Secondary | ICD-10-CM

## 2012-05-24 DIAGNOSIS — R079 Chest pain, unspecified: Secondary | ICD-10-CM

## 2012-05-24 NOTE — Patient Instructions (Addendum)
  Avoids foods high in acid such as tomatoes citrus juices, and spicy foods.  Avoid eating within two hours of lying down or before exercising.  Do not overheat.  Try smaller more frequent meals.  If symptoms persist, elevate the head of her bed 12 inches while sleeping.  Smoking tobacco is very bad for your health. You should stop smoking immediately.  Call or return to clinic prn if these symptoms worsen or fail to improve as anticipated.  Zegerid  One daily  Diet for Gastroesophageal Reflux Disease, Child Some children have small, brief episodes of reflux. Reflux (acid reflux) is when acid from your stomach flows up into the esophagus. When acid comes in contact with the esophagus, the acid causes irritation and soreness (inflammation) in the esophagus. The reflux may be so small that a child may not notice it. When reflux happens often or so severely that it causes damage to the esophagus, it is called gastroesophageal reflux disease (GERD). Nutrition therapy can help ease the discomfort of GERD.   FOODS TO AVOID    Caffeinated and decaffeinated coffee and black tea.   Regular or low-calorie carbonated beverages or energy drinks (caffeine-free carbonated beverages are allowed).   Strong spices, such as black pepper, white pepper, red pepper, cayenne, curry powder, and chili powder.   Peppermint or spearmint.   Chocolate.   High-fat foods, including meats and fried foods. Extra added fats including oils, butter, salad dressings, and nuts. Low-fat foods may not be recommended for children less than 4 years of age. Discuss this with your doctor or dietitian.   Fruits and vegetables that are not tolerated, such as citrus fruits and tomatoes.   Any food that seems to aggravate the child's condition.  If you have questions regarding your child's diet, call your caregiver or a registered dietician. OTHER THINGS THAT MAY HELP GERD INCLUDE:  Having the child eat his or her meals slowly, in a  relaxed setting.   Serving several small meals throughout the day instead of 3 large meals.   Eliminating food for a period of time if it causes distress.   Not letting the child lie down immediately after eating a meal.   Keeping the head of the child's bed raised 6 to 9 inches (15 to 23 cm) by using a foam wedge or blocks under the legs of the bed.   Encouraging the child to be physically active. Weight loss may be helpful in reducing reflux in overweight or obese children.   Having the child wear loose-fitting clothing.   Avoiding the use of tobacco in parents and caregivers. Secondhand smoke may aggravate symptoms in children with reflux.  SAMPLE MEAL PLAN This is a sample meal plan for a 53 to 53 year old child and is approximately 1200 calories based on https://www.[facility].org/ meal planning guidelines.   Breakfast   cup cooked oatmeal.    cup strawberries.    cup low-fat milk.  Snack   cup cucumber slices.   4 oz yogurt (made from low-fat milk).  Lunch  1 slice whole-wheat bread.   1 oz chicken.    cup blueberries.    cup snap peas.  Snack  3 whole-wheat crackers.   1 oz string cheese.  Dinner   cup brown rice.    cup mixed veggies.   1 cup low-fat milk.   2 oz grilled fish.  Document Released: 02/26/2007 Document Revised: 09/29/2011 Document Reviewed: 09/01/2011 [facility] Patient Information 2012 [place], [place].

## 2012-05-24 NOTE — Progress Notes (Signed)
Subjective:    Patient ID: Travis Rice, male    DOB: 18-Dec-1960, 51 y.o.   MRN: [id number]  HPI  51 year old patient who is seen today as a walk-in with a chief complaint of chest pain. He states that he was awoken at 4:30 AM with a sense of fullness and gaseousness. This has persisted throughout the day. The discomfort seems to migrate and presently is present in the mid back area. Associated symptoms including belching which does alleviate the pain. Pain is also alleviated by certain changes in position. He describes some mild increased discomfort with swallowing. He states that he consumed spicy [place] food the night prior. Problem list includes coronary artery disease and ongoing tobacco use. EKG was reviewed today and was unchanged with some stable repolarization abnormalities.  Past Medical History  Diagnosis Date  . Abnormal glucose   . Palpitations   . Abnormal partial thromboplastin time (PTT)   . Lupus anticoagulant positive     but negative for antiphosolipid antibody and anti B2 glycoprotein antibody    History   Social History  . Marital Status: Divorced    Spouse Name: N/A    Number of Children: N/A  . Years of Education: N/A   Occupational History  . Not on file.   Social History Main Topics  . Smoking status: Current Everyday Smoker  . Smokeless tobacco: Not on file  . Alcohol Use: Yes  . Drug Use: No  . Sexually Active: Not on file   Other Topics Concern  . Not on file   Social History Narrative  . No narrative on file    No past surgical history on file.  Family History  Problem Relation Age of Onset  . Hypertension Mother   . Hypertension Father     No Known Allergies  Current Outpatient Prescriptions on File Prior to Visit  Medication Sig Dispense Refill  . loperamide (IMODIUM A-D) 2 MG tablet Take 1 tablet (2 mg total) by mouth 4 (four) times daily as needed for diarrhea or loose stools.  30 tablet  0    BP 122/82  Pulse 100  Temp 98.4  F (36.9 C) (Oral)  Wt 180 lb (81.647 kg)  SpO2 96%       Review of Systems  Constitutional: Negative for fever, chills, appetite change and fatigue.  HENT: Negative for hearing loss, ear pain, congestion, sore throat, trouble swallowing, neck stiffness, dental problem, voice change and tinnitus.   Eyes: Negative for pain, discharge and visual disturbance.  Respiratory: Negative for cough, chest tightness, wheezing and stridor.   Cardiovascular: Positive for chest pain. Negative for palpitations and leg swelling.  Gastrointestinal: Positive for abdominal pain. Negative for nausea, vomiting, diarrhea, constipation, blood in stool and abdominal distention.  Genitourinary: Negative for urgency, hematuria, flank pain, discharge, difficulty urinating and genital sores.  Musculoskeletal: Negative for myalgias, back pain, joint swelling, arthralgias and gait problem.  Skin: Negative for rash.  Neurological: Negative for dizziness, syncope, speech difficulty, weakness, numbness and headaches.  Hematological: Negative for adenopathy. Does not bruise/bleed easily.  Psychiatric/Behavioral: Negative for behavioral problems and dysphoric mood. The patient is not nervous/anxious.        Objective:   Physical Exam  Constitutional: He is oriented to person, place, and time. He appears well-developed.  HENT:  Head: Normocephalic.  Right Ear: External ear normal.  Left Ear: External ear normal.  Eyes: Conjunctivae and EOM are normal.  Neck: Normal range of motion.  Cardiovascular: Normal rate, regular  rhythm and normal heart sounds.   Pulmonary/Chest: Breath sounds normal.  Abdominal: Soft. Bowel sounds are normal. There is tenderness.        mild epigastric tenderness  Musculoskeletal: Normal range of motion. He exhibits no edema and no tenderness.  Neurological: He is alert and oriented to person, place, and time.  Psychiatric: He has a normal mood and affect. His behavior is normal.           Assessment & Plan:   Dyspepsia.  The patient was given samples of Zegerid to take 1 daily. He was given his initial dose in the examining room. Antireflux regimen discussed and written instructions were provided  Tobacco abuse. Cessation of smoking encouraged

## 2012-05-25 ENCOUNTER — Ambulatory Visit (INDEPENDENT_AMBULATORY_CARE_PROVIDER_SITE_OTHER): Payer: Federal, State, Local not specified - PPO | Admitting: Internal Medicine

## 2012-05-25 ENCOUNTER — Encounter: Payer: Self-pay | Admitting: Internal Medicine

## 2012-05-25 VITALS — BP 110/80 | HR 96 | Temp 98.5°F

## 2012-05-25 DIAGNOSIS — L259 Unspecified contact dermatitis, unspecified cause: Secondary | ICD-10-CM

## 2012-05-25 DIAGNOSIS — L309 Dermatitis, unspecified: Secondary | ICD-10-CM

## 2012-05-25 MED ORDER — METHYLPREDNISOLONE ACETATE 80 MG/ML IJ SUSP
80.0000 mg | Freq: Once | INTRAMUSCULAR | Status: AC
  Administered 2012-05-25: 80 mg via INTRAMUSCULAR

## 2012-05-25 NOTE — Patient Instructions (Signed)
Pepcid AC twice daily Zyrtec once daily  Call or return to clinic prn if these symptoms worsen or fail to improve as anticipated.

## 2012-05-25 NOTE — Progress Notes (Signed)
  Subjective:    Patient ID: Travis Rice, male    DOB: [birth year], 51 y.o.   MRN: [id number]  HPI 51 year old patient who was placed on Zegerid yesterday who developed urticaria at approximately 9:30 PM last night. No other suggestive factors. No wheezing.     Review of Systems  Skin: Positive for rash.       Objective:   Physical Exam  Constitutional: He appears well-developed and well-nourished. No distress.  Skin: Rash noted.       Urticarial rash most marked over the trunk. He also has some soft tissue swelling involving the hands          Assessment & Plan:   Urticaria     Urticaria-  will discontinue Zegerid. Treat with Depo-Medrol Zyrtec and Pepcid AC

## 2012-05-28 ENCOUNTER — Encounter: Payer: Self-pay | Admitting: Internal Medicine

## 2012-05-28 ENCOUNTER — Ambulatory Visit: Payer: Federal, State, Local not specified - PPO | Admitting: Internal Medicine

## 2012-05-28 ENCOUNTER — Ambulatory Visit (INDEPENDENT_AMBULATORY_CARE_PROVIDER_SITE_OTHER): Payer: Federal, State, Local not specified - PPO | Admitting: Internal Medicine

## 2012-05-28 VITALS — BP 124/88 | Temp 98.7°F | Wt 175.0 lb

## 2012-05-28 DIAGNOSIS — L509 Urticaria, unspecified: Secondary | ICD-10-CM

## 2012-05-28 NOTE — Progress Notes (Signed)
  Subjective:    Patient ID: Travis Rice, male    DOB: [birth year], 51 y.o.   MRN: [id number]  HPI  51 year old patient who is seen today for followup of urticaria. The patient was seen 3 days ago with urticaria which occurred following a single dose of Zegerid.  He was placed on histamine blockers and treated with Depo-Medrol. There was some initial improvement but then about a week and has worsened. The rash is quite pruritic. In  February, he was seen by dermatology for a macular rash  that was apparently was also quite pruritic. He did have a skin biopsy performed from the left anterior thigh region. Past medical history is pertinent for a history of a positive lupus anticoagulant    Review of Systems  Skin: Positive for rash.       Objective:   Physical Exam  Constitutional: He appears well-developed and well-nourished. No distress.  Cardiovascular:       Mild resting tachycardia  Pulmonary/Chest: Effort normal and breath sounds normal. He has no wheezes.  Skin:       Fairly diffuse urticarial rash involving the trunk and extremities. There is some swelling involving his right hand          Assessment & Plan:   Diffuse urticaria. This has not responded well to parenteral steroids H1 and H2 blockers. We'll attempt to set up for a allergy evaluation today

## 2012-06-04 ENCOUNTER — Ambulatory Visit (INDEPENDENT_AMBULATORY_CARE_PROVIDER_SITE_OTHER): Payer: Federal, State, Local not specified - PPO | Admitting: Family Medicine

## 2012-06-04 ENCOUNTER — Encounter: Payer: Self-pay | Admitting: Family Medicine

## 2012-06-04 VITALS — BP 120/80 | Temp 99.2°F | Wt 172.0 lb

## 2012-06-04 DIAGNOSIS — R1031 Right lower quadrant pain: Secondary | ICD-10-CM

## 2012-06-04 DIAGNOSIS — R197 Diarrhea, unspecified: Secondary | ICD-10-CM

## 2012-06-04 LAB — BASIC METABOLIC PANEL
BUN: 11 mg/dL (ref 6–23)
Calcium: 9.3 mg/dL (ref 8.4–10.5)
Creatinine, Ser: 0.9 mg/dL (ref 0.4–1.5)
GFR: 111.87 mL/min (ref >60.00)

## 2012-06-04 LAB — HEPATIC FUNCTION PANEL
ALT: 69 U/L — ABNORMAL HIGH (ref 0–53)
AST: 23 U/L (ref 0–37)
Alkaline Phosphatase: 117 U/L (ref 39–117)
Bilirubin, Direct: 0 mg/dL (ref 0.0–0.3)
Total Bilirubin: 0.4 mg/dL (ref 0.3–1.2)

## 2012-06-04 MED ORDER — DIPHENOXYLATE-ATROPINE 2.5-0.025 MG PO TABS: 1.0000 | ORAL_TABLET | Freq: Four times a day (QID) | ORAL | Status: AC | PRN

## 2012-06-04 NOTE — Patient Instructions (Addendum)
Diarrhea Infections caused by germs (bacterial) or a virus commonly cause diarrhea. Your caregiver has determined that with time, rest and fluids, the diarrhea should improve. In general, eat normally while drinking more water than usual. Although water may prevent dehydration, it does not contain salt and minerals (electrolytes). Broths, weak tea without caffeine and oral rehydration solutions (ORS) replace fluids and electrolytes. Small amounts of fluids should be taken frequently. Large amounts at one time may not be tolerated. Plain water may be harmful in infants and the elderly. Oral rehydrating solutions (ORS) are available at pharmacies and grocery stores. ORS replace water and important electrolytes in proper proportions. Sports drinks are not as effective as ORS and may be harmful due to sugars worsening diarrhea.  ORS is especially recommended for use in children with diarrhea. As a general guideline for children, replace any new fluid losses from diarrhea and/or vomiting with ORS as follows:   If your child weighs 22 pounds or under (10 kg or less), give 60-120 mL ( -  cup or 2 - 4 ounces) of ORS for each episode of diarrheal stool or vomiting episode.   If your child weighs more than 22 pounds (more than 10 kgs), give 120-240 mL ( - 1 cup or 4 - 8 ounces) of ORS for each diarrheal stool or episode of vomiting.   While correcting for dehydration, children should eat normally. However, foods high in sugar should be avoided because this may worsen diarrhea. Large amounts of carbonated soft drinks, juice, gelatin desserts and other highly sugared drinks should be avoided.   After correction of dehydration, other liquids that are appealing to the child may be added. Children should drink small amounts of fluids frequently and fluids should be increased as tolerated. Children should drink enough fluids to keep urine clear or pale yellow.   Adults should eat normally while drinking more fluids  than usual. Drink small amounts of fluids frequently and increase as tolerated. Drink enough fluids to keep urine clear or pale yellow. Broths, weak decaffeinated tea, lemon lime soft drinks (allowed to go flat) and ORS replace fluids and electrolytes.   Avoid:   Carbonated drinks.   Juice.   Extremely hot or cold fluids.   Caffeine drinks.   Fatty, greasy foods.   Alcohol.   Tobacco.   Too much intake of anything at one time.   Gelatin desserts.   Probiotics are active cultures of beneficial bacteria. They may lessen the amount and number of diarrheal stools in adults. Probiotics can be found in yogurt with active cultures and in supplements.   Wash hands well to avoid spreading bacteria and virus.   Anti-diarrheal medications are not recommended for infants and children.   Only take over-the-counter or prescription medicines for pain, discomfort or fever as directed by your caregiver. Do not give aspirin to children because it may cause Reye's Syndrome.   For adults, ask your caregiver if you should continue all prescribed and over-the-counter medicines.   If your caregiver has given you a follow-up appointment, it is very important to keep that appointment. Not keeping the appointment could result in a chronic or permanent injury, and disability. If there is any problem keeping the appointment, you must call back to this facility for assistance.  SEEK IMMEDIATE MEDICAL CARE IF:   You or your child is unable to keep fluids down or other symptoms or problems become worse in spite of treatment.   Vomiting or diarrhea develops and becomes persistent.     There is vomiting of blood or bile (green material).   There is blood in the stool or the stools are black and tarry.   There is no urine output in 6-8 hours or there is only a small amount of very dark urine.   Abdominal pain develops, increases or localizes.   You have a fever.   Your baby is older than 3 months with a  rectal temperature of 102 F (38.9 C) or higher.   Your baby is 3 months old or younger with a rectal temperature of 100.4 F (38 C) or higher.   You or your child develops excessive weakness, dizziness, fainting or extreme thirst.   You or your child develops a rash, stiff neck, severe headache or become irritable or sleepy and difficult to awaken.  MAKE SURE YOU:   Understand these instructions.   Will watch your condition.   Will get help right away if you are not doing well or get worse.  Document Released: 09/30/2002 Document Revised: 09/29/2011 Document Reviewed: 08/17/2009 [employer] Patient Information 2012 [employer]. 

## 2012-06-04 NOTE — Progress Notes (Signed)
  Subjective:    Patient ID: Travis Rice, male    DOB: 1961/04/05, 51 y.o.   MRN: [id number]  HPI  Patient seen with some persistent bilateral lower abdominal cramp-like discomfort and intermittent diarrhea. History is that around August 1 he developed some gaseous type migratory discomfort. Was placed on Zegerid and after one dose developed extensive hives. He was given Depo-Medrol and Pepcid and eventually saw allergist. Was treated with prednisone and hydroxyzine. Hives are fading at this time. He still has persistent bilateral migratory abdominal cramps with about 2 watery small volume nonbloody stools per day. Is eating very little. Poor appetite. No fever. No nausea or vomiting. No abdominal distention. He tried over-the-counter simethicone without relief.  No recent antibiotics. No recent travels.  No bloody stools. Had colonoscopy at age 89 which was reportedly normal. Has lost about 8 pounds in body weight since onset. He has not tried any antidiarrhea medications. No localizing abdominal pain. Denies any current upper abdominal pain or back pain. Former smoker and quit with recent illness. No chest pain. Previous cardiac catheterization couple years ago normal coronaries  Past Medical History  Diagnosis Date  . Abnormal glucose   . Palpitations   . Abnormal partial thromboplastin time (PTT)   . Lupus anticoagulant positive     but negative for antiphosolipid antibody and anti B2 glycoprotein antibody   No past surgical history on file.  reports that he quit smoking 11 days ago. He does not have any smokeless tobacco history on file. He reports that he drinks alcohol. He reports that he does not use illicit drugs. family history includes Hypertension in his father and mother. Allergies  Allergen Reactions  . Zegerid (Omeprazole-Sodium Bicarbonate) Anaphylaxis    Rash, lip and hand swelling, itching , throat swelling      Review of Systems  Constitutional: Positive for appetite  change, fatigue and unexpected weight change. Negative for fever and chills.  Respiratory: Negative for cough and shortness of breath.   Cardiovascular: Negative for chest pain, palpitations and leg swelling.  Gastrointestinal: Positive for abdominal pain and diarrhea. Negative for nausea, vomiting and blood in stool.  Genitourinary: Negative for dysuria.  Neurological: Negative for dizziness.       Objective:   Physical Exam  Constitutional: He appears well-developed and well-nourished. No distress.  HENT:  Mouth/Throat: Oropharynx is clear and moist.  Neck: Neck supple. No thyromegaly present.  Cardiovascular: Normal rate and regular rhythm.   Pulmonary/Chest: Effort normal and breath sounds normal. No respiratory distress. He has no wheezes. He has no rales.  Abdominal: Soft. Bowel sounds are normal. He exhibits no distension and no mass. There is no tenderness. There is no rebound and no guarding.  Skin:       Has only a few urticaria upper extremities and back but these are fading.          Assessment & Plan:  #1 Persistent diffuse migratory lower abdominal cramping associated with mild low-volume diarrhea. Question viral. Check labs with CBC, basic metabolic panel, hepatic panel. Trial of Lomotil 1-2 tablets every 6 hours as needed. Follow up promptly for any fever or new symptoms #2 recent urticaria possibly related to Zegerid. These are fading and essentially resolved.

## 2012-06-05 LAB — CBC WITH DIFFERENTIAL/PLATELET
Eosinophils Relative: 0.1 % (ref 0.0–5.0)
Lymphocytes Relative: 7.9 % — ABNORMAL LOW (ref 12.0–46.0)
Monocytes Absolute: 0.2 10*3/uL (ref 0.1–1.0)
Monocytes Relative: 1.3 % — ABNORMAL LOW (ref 3.0–12.0)
Neutrophils Relative %: 90.5 % — ABNORMAL HIGH (ref 43.0–77.0)
Platelets: 600 10*3/uL — ABNORMAL HIGH (ref 150.0–400.0)
WBC: 17.2 10*3/uL — ABNORMAL HIGH (ref 4.5–10.5)

## 2012-06-06 ENCOUNTER — Telehealth: Payer: Self-pay | Admitting: Internal Medicine

## 2012-06-06 NOTE — Progress Notes (Signed)
Quick Note:  Pt did have breakfast, so he was not fasting and he reports feeling much better today, C/O some gas and plans to try Gas-X, denies fever. ______

## 2012-06-06 NOTE — Telephone Encounter (Signed)
Pt seen Dr. Burchette. 

## 2012-06-06 NOTE — Telephone Encounter (Signed)
Pt requesting results of lab work.

## 2012-06-08 ENCOUNTER — Telehealth: Payer: Self-pay | Admitting: Internal Medicine

## 2012-06-08 NOTE — Telephone Encounter (Signed)
Patient Janice calling.  Has another rash on his back.  The original rash started on 8/1.  He was seen in the office and sent to see an allergy specialist.  Has no appetite and diarrhea.   He recieved a Cortisone injection and po Prednisone.  He stopped the Prednisone 5 days ago due to stomach issues.  The rash keeps coming back and he is very frustrated.   He wants to be seen today, no appointments are available with any provider.  He is going to UC now to be seen.  Requesting an appointment for one day next week with Dr. Artist Pais, transferred to appointment line for same.

## 2012-06-11 ENCOUNTER — Ambulatory Visit (INDEPENDENT_AMBULATORY_CARE_PROVIDER_SITE_OTHER): Payer: Federal, State, Local not specified - PPO | Admitting: Internal Medicine

## 2012-06-11 ENCOUNTER — Encounter: Payer: Self-pay | Admitting: Internal Medicine

## 2012-06-11 VITALS — BP 126/92 | Temp 100.1°F | Wt 169.0 lb

## 2012-06-11 DIAGNOSIS — R1013 Epigastric pain: Secondary | ICD-10-CM

## 2012-06-11 DIAGNOSIS — K3189 Other diseases of stomach and duodenum: Secondary | ICD-10-CM

## 2012-06-11 DIAGNOSIS — R140 Abdominal distension (gaseous): Secondary | ICD-10-CM

## 2012-06-11 DIAGNOSIS — R110 Nausea: Secondary | ICD-10-CM

## 2012-06-11 DIAGNOSIS — R141 Gas pain: Secondary | ICD-10-CM

## 2012-06-11 DIAGNOSIS — R630 Anorexia: Secondary | ICD-10-CM

## 2012-06-11 DIAGNOSIS — L509 Urticaria, unspecified: Secondary | ICD-10-CM

## 2012-06-11 LAB — CBC WITH DIFFERENTIAL/PLATELET
Basophils Absolute: 0.1 10*3/uL (ref 0.0–0.1)
HCT: 37.4 % — ABNORMAL LOW (ref 39.0–52.0)
Lymphs Abs: 1.7 10*3/uL (ref 0.7–4.0)
MCV: 95.4 fl (ref 78.0–100.0)
Monocytes Absolute: 0.3 10*3/uL (ref 0.1–1.0)
Platelets: 635 10*3/uL — ABNORMAL HIGH (ref 150.0–400.0)
RDW: 13.4 % (ref 11.5–14.6)

## 2012-06-11 LAB — BASIC METABOLIC PANEL
BUN: 10 mg/dL (ref 6–23)
Chloride: 105 mEq/L (ref 96–112)
GFR: 107.75 mL/min (ref >60.00)
Glucose, Bld: 103 mg/dL — ABNORMAL HIGH (ref 70–99)
Potassium: 3.9 mEq/L (ref 3.5–5.1)

## 2012-06-11 LAB — LIPASE: Lipase: 37 U/L (ref 11.0–59.0)

## 2012-06-11 LAB — HEPATIC FUNCTION PANEL: Albumin: 3.3 g/dL — ABNORMAL LOW (ref 3.5–5.2)

## 2012-06-11 LAB — SEDIMENTATION RATE: Sed Rate: 102 mm/hr — ABNORMAL HIGH (ref 0–22)

## 2012-06-11 MED ORDER — FLORA-Q PO CAPS: 1.0000 | ORAL_CAPSULE | Freq: Every day | ORAL | Status: DC

## 2012-06-11 MED ORDER — RANITIDINE HCL 150 MG PO TABS: 150.0000 mg | ORAL_TABLET | Freq: Two times a day (BID) | ORAL | Status: DC

## 2012-06-11 NOTE — Assessment & Plan Note (Signed)
Patient reports hx of "sensitive stomach".  He is currently experiencing anorexia, heartburn abdominal bloating. Suspect symptoms are secondary to possible viral infection. Supportive care. Start probiotic.

## 2012-06-11 NOTE — Assessment & Plan Note (Addendum)
I doubt patient's urticaria secondary to starting proton pump inhibitor. Consider viral trigger. Rash is significantly improved with prednisone taper. Continue to use Zyrtec and ranitidine for pruritus.  Watchful waiting for now. Patient advised to call office if symptoms worsen.  Addendum - consider acquired angioedema.  Refer to Dr. Maple Hudson.  Obtain C4 level, C1 inhibitor antigenic level and C1q level. Abdominal u/s normal.  Consider obtain CT of chest, abd and pelvis to rule out lymphatic malignancy.  Defer to allergist.

## 2012-06-11 NOTE — Progress Notes (Signed)
Subjective:    Patient ID: Travis Rice, male    DOB: [birth year], 51 y.o.   MRN: [id number]  HPI  50 year old [place] American male for followup regarding allergic dermatitis. Patient seen by Dr. Kirtland Bouchard  for dyspepsia and heartburn. He was prescribed Zegerid. Shortly after taking this medication, patient experienced diffuse rash, facial and hand swelling.   He was referred to local allergist. He recently finished prednisone taper on Thursday.  He continues to have anorexia and feels weak and shaky. His lost approximately 11 pounds since episode started. He complains of abdominal bloating and increased burping.  Previous lab results showed neutrophilia presumed secondary to steroid use.  He also have mild elevation in ALT and thrombocytosis.  Review of Systems Low grade fever and anorexia  Past Medical History  Diagnosis Date  . Abnormal glucose   . Palpitations   . Abnormal partial thromboplastin time (PTT)   . Lupus anticoagulant positive     but negative for antiphosolipid antibody and anti B2 glycoprotein antibody    History   Social History  . Marital Status: Divorced    Spouse Name: N/A    Number of Children: N/A  . Years of Education: N/A   Occupational History  . Not on file.   Social History Main Topics  . Smoking status: Former Smoker    Quit date: 05/24/2012  . Smokeless tobacco: Not on file  . Alcohol Use: Yes  . Drug Use: No  . Sexually Active: Not on file   Other Topics Concern  . Not on file   Social History Narrative  . No narrative on file    No past surgical history on file.  Family History  Problem Relation Age of Onset  . Hypertension Mother   . Hypertension Father     Allergies  Allergen Reactions  . Zegerid (Omeprazole-Sodium Bicarbonate) Anaphylaxis    Rash, lip and hand swelling, itching , throat swelling    Current Outpatient Prescriptions on File Prior to Visit  Medication Sig Dispense Refill  . diphenoxylate-atropine (LOMOTIL)  2.5-0.025 MG per tablet Take 1 tablet by mouth 4 (four) times daily as needed for diarrhea or loose stools.  30 tablet  0  . cetirizine (ZYRTEC) 10 MG tablet Take 1 tablet (10 mg total) by mouth daily.  30 tablet  11  . ranitidine (ZANTAC) 150 MG tablet Take 1 tablet (150 mg total) by mouth 2 (two) times daily.  60 tablet  1    BP 126/92  Temp 100.1 F (37.8 C) (Oral)  Wt 169 lb (76.658 kg)        Objective:   Physical Exam  Constitutional: He is oriented to person, place, and time. He appears well-developed and well-nourished. No distress.  HENT:  Head: Normocephalic and atraumatic.  Right Ear: External ear normal.  Left Ear: External ear normal.  Mouth/Throat: Oropharynx is clear and moist.  Eyes: EOM are normal. Pupils are equal, round, and reactive to light.  Neck: Neck supple.  Cardiovascular: Normal rate and regular rhythm.   Pulmonary/Chest: Effort normal and breath sounds normal. He has no wheezes.  Abdominal: Soft. He exhibits distension. He exhibits no mass. There is no tenderness.       Increase in bowel sounds  Musculoskeletal: He exhibits no edema.  Lymphadenopathy:    He has no cervical adenopathy.  Neurological: He is alert and oriented to person, place, and time.  Skin:       Scattered urticarial interruption over arms and back  Assessment & Plan:

## 2012-06-14 ENCOUNTER — Ambulatory Visit
Admission: RE | Admit: 2012-06-14 | Discharge: 2012-06-14 | Disposition: A | Discharge status: Home / Self Care | Payer: Federal, State, Local not specified - PPO | Source: Ambulatory Visit | Attending: Internal Medicine | Admitting: Internal Medicine

## 2012-06-14 ENCOUNTER — Telehealth: Payer: Self-pay | Admitting: Internal Medicine

## 2012-06-14 DIAGNOSIS — R630 Anorexia: Secondary | ICD-10-CM

## 2012-06-14 DIAGNOSIS — R110 Nausea: Secondary | ICD-10-CM

## 2012-06-14 DIAGNOSIS — R140 Abdominal distension (gaseous): Secondary | ICD-10-CM

## 2012-06-14 MED ORDER — SACCHAROMYCES BOULARDII 250 MG PO CAPS: 250.0000 mg | ORAL_CAPSULE | Freq: Two times a day (BID) | ORAL | Status: AC

## 2012-06-14 NOTE — Telephone Encounter (Signed)
rx sent in electronically 

## 2012-06-14 NOTE — Telephone Encounter (Signed)
Patient called stating that the probiotic that he was prescribed has been discontinued per his pharmacy [facility] [address] and would like to have something different called in. Please advise.

## 2012-06-14 NOTE — Telephone Encounter (Signed)
Please change to florastor

## 2012-06-18 ENCOUNTER — Other Ambulatory Visit: Payer: Federal, State, Local not specified - PPO

## 2012-06-18 ENCOUNTER — Other Ambulatory Visit: Payer: Self-pay | Admitting: Internal Medicine

## 2012-06-18 DIAGNOSIS — T783XXA Angioneurotic edema, initial encounter: Secondary | ICD-10-CM

## 2012-06-18 DIAGNOSIS — L509 Urticaria, unspecified: Secondary | ICD-10-CM

## 2012-06-19 LAB — C4 COMPLEMENT: C4 Complement: 47 mg/dL — ABNORMAL HIGH (ref 10–40)

## 2012-06-20 LAB — C1 ESTERASE INHIBITOR: C1INH SerPl-mCnc: 60 mg/dL — ABNORMAL HIGH (ref 21–39)

## 2012-07-13 ENCOUNTER — Other Ambulatory Visit: Payer: Federal, State, Local not specified - PPO

## 2013-01-11 ENCOUNTER — Encounter: Payer: Self-pay | Admitting: Internal Medicine

## 2013-01-11 ENCOUNTER — Ambulatory Visit (INDEPENDENT_AMBULATORY_CARE_PROVIDER_SITE_OTHER): Payer: Federal, State, Local not specified - PPO | Admitting: Internal Medicine

## 2013-01-11 VITALS — BP 130/88 | HR 68 | Temp 98.3°F | Wt 174.0 lb

## 2013-01-11 DIAGNOSIS — M25562 Pain in left knee: Secondary | ICD-10-CM

## 2013-01-11 DIAGNOSIS — M25569 Pain in unspecified knee: Secondary | ICD-10-CM

## 2013-01-11 MED ORDER — CELECOXIB 200 MG PO CAPS: 200.0000 mg | ORAL_CAPSULE | Freq: Two times a day (BID) | ORAL | Status: DC

## 2013-01-11 NOTE — Patient Instructions (Addendum)
Perform left leg exercises as directed Please contact our office if your symptoms do not improve or gets worse.

## 2013-01-11 NOTE — Assessment & Plan Note (Signed)
52 year old African American male complains of left knee pain. He woke up this morning with swelling and tenderness. No report of trauma or injury. He has remote history of left knee arthroscopy. On exam he has tenderness with compression of patella. 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 Samples of Celebrex 200 mg to take once daily as needed provided. Also obtain left knee x-ray to further assess left knee arthritis.  I also reviewed left quadricep strengthening exercises. Reassess in 2 months.

## 2013-01-11 NOTE — Progress Notes (Signed)
  Subjective:    Patient ID: Travis Frames., male    DOB: [birth year], 52 y.o.   MRN: [id number]  HPI  52 year old African American male with history of tobacco use and hyperglycemia complains of left knee pain. He reports remote history of left knee arthroscopy 10 years ago. He woke up this morning with left knee swelling and pain. He denies any joint redness. There is no report of specific trauma or injury. 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  Review of Systems Negative for fever or chills  Past Medical History  Diagnosis Date  . Abnormal glucose   . Palpitations   . Abnormal partial thromboplastin time (PTT)   . Lupus anticoagulant positive     but negative for antiphosolipid antibody and anti B2 glycoprotein antibody    History   Social History  . Marital Status: Divorced    Spouse Name: N/A    Number of Children: N/A  . Years of Education: N/A   Occupational History  . Not on file.   Social History Main Topics  . Smoking status: Former Smoker    Quit date: 05/24/2012  . Smokeless tobacco: Not on file  . Alcohol Use: Yes  . Drug Use: No  . Sexually Active: Not on file   Other Topics Concern  . Not on file   Social History Narrative  . No narrative on file    No past surgical history on file.  Family History  Problem Relation Age of Onset  . Hypertension Mother   . Hypertension Father     Allergies  Allergen Reactions  . Zegerid (Omeprazole-Sodium Bicarbonate) Anaphylaxis    Rash, lip and hand swelling, itching , throat swelling    No current outpatient prescriptions on file prior to visit.   No current facility-administered medications on file prior to visit.    BP 130/88  Pulse 68  Temp(Src) 98.3 F (36.8 C) (Oral)  Wt 174 lb (78.926 kg)  BMI 28.1 kg/m2       Objective:   Physical Exam  Constitutional: He appears well-developed and well-nourished.  Cardiovascular: Normal rate,  regular rhythm and normal heart sounds.   Pulmonary/Chest: Effort normal and breath sounds normal. He has no wheezes.  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 His behavior is normal.          Assessment & Plan:

## 2013-01-14 ENCOUNTER — Other Ambulatory Visit: Payer: Federal, State, Local not specified - PPO

## 2013-01-22 ENCOUNTER — Encounter: Payer: Federal, State, Local not specified - PPO | Admitting: Internal Medicine

## 2013-01-30 ENCOUNTER — Other Ambulatory Visit (INDEPENDENT_AMBULATORY_CARE_PROVIDER_SITE_OTHER): Payer: Federal, State, Local not specified - PPO

## 2013-01-30 DIAGNOSIS — Z0000 Encounter for general adult medical examination without abnormal findings: Secondary | ICD-10-CM

## 2013-01-30 LAB — CBC WITH DIFFERENTIAL/PLATELET
Basophils Relative: 0.3 % (ref 0.0–3.0)
Eosinophils Relative: 2.2 % (ref 0.0–5.0)
Lymphocytes Relative: 29 % (ref 12.0–46.0)
Monocytes Absolute: 1.1 10*3/uL — ABNORMAL HIGH (ref 0.1–1.0)
Monocytes Relative: 11.9 % (ref 3.0–12.0)
Neutrophils Relative %: 56.6 % (ref 43.0–77.0)
Platelets: 480 10*3/uL — ABNORMAL HIGH (ref 150.0–400.0)
RBC: 4.49 Mil/uL (ref 4.22–5.81)
WBC: 9.4 10*3/uL (ref 4.5–10.5)

## 2013-01-30 LAB — POCT URINALYSIS DIPSTICK
Bilirubin, UA: NEGATIVE
Glucose, UA: NEGATIVE
Ketones, UA: NEGATIVE
Nitrite, UA: NEGATIVE

## 2013-01-30 LAB — HEPATIC FUNCTION PANEL
ALT: 25 U/L (ref 0–53)
AST: 17 U/L (ref 0–37)
Alkaline Phosphatase: 61 U/L (ref 39–117)
Bilirubin, Direct: 0.1 mg/dL (ref 0.0–0.3)
Total Bilirubin: 1.1 mg/dL (ref 0.3–1.2)

## 2013-01-30 LAB — LIPID PANEL
HDL: 64.2 mg/dL (ref >39.00)
Triglycerides: 103 mg/dL (ref 0.0–149.0)
VLDL: 20.6 mg/dL (ref 0.0–40.0)

## 2013-01-30 LAB — BASIC METABOLIC PANEL
BUN: 23 mg/dL (ref 6–23)
Creatinine, Ser: 1.2 mg/dL (ref 0.4–1.5)
GFR: 85.18 mL/min (ref >60.00)

## 2013-01-30 LAB — LDL CHOLESTEROL, DIRECT: Direct LDL: 144.4 mg/dL

## 2013-01-30 LAB — PSA: PSA: 0.37 ng/mL (ref 0.10–4.00)

## 2013-02-07 ENCOUNTER — Encounter: Payer: Federal, State, Local not specified - PPO | Admitting: Internal Medicine

## 2013-02-11 ENCOUNTER — Ambulatory Visit (INDEPENDENT_AMBULATORY_CARE_PROVIDER_SITE_OTHER): Payer: Federal, State, Local not specified - PPO | Admitting: Internal Medicine

## 2013-02-11 ENCOUNTER — Ambulatory Visit (INDEPENDENT_AMBULATORY_CARE_PROVIDER_SITE_OTHER)
Admission: RE | Admit: 2013-02-11 | Discharge: 2013-02-11 | Disposition: A | Discharge status: Home / Self Care | Payer: Federal, State, Local not specified - PPO | Source: Ambulatory Visit | Attending: Internal Medicine | Admitting: Internal Medicine

## 2013-02-11 ENCOUNTER — Encounter: Payer: Self-pay | Admitting: Internal Medicine

## 2013-02-11 VITALS — BP 140/92 | HR 89 | Temp 98.2°F | Ht 66.0 in | Wt 178.0 lb

## 2013-02-11 DIAGNOSIS — M25569 Pain in unspecified knee: Secondary | ICD-10-CM

## 2013-02-11 DIAGNOSIS — M25562 Pain in left knee: Secondary | ICD-10-CM

## 2013-02-11 MED ORDER — HYDROCODONE-ACETAMINOPHEN 5-325 MG PO TABS: 1.0000 | ORAL_TABLET | Freq: Four times a day (QID) | ORAL | Status: DC | PRN

## 2013-02-11 NOTE — Patient Instructions (Signed)
Knee Pain The knee is the complex joint between your thigh and your lower leg. It is made up of bones, tendons, ligaments, and cartilage. The bones that make up the knee are:  The femur in the thigh.  The tibia and fibula in the lower leg.  The patella or kneecap riding in the groove on the lower femur. CAUSES  Knee pain is a common complaint with many causes. A few of these causes are:  Injury, such as:  A ruptured ligament or tendon injury.  Torn cartilage.  Medical conditions, such as:  Gout  Arthritis  Infections  Overuse, over training or overdoing a physical activity. Knee pain can be minor or severe. Knee pain can accompany debilitating injury. Minor knee problems often respond well to self-care measures or get well on their own. More serious injuries may need medical intervention or even surgery. SYMPTOMS The knee is complex. Symptoms of knee problems can vary widely. Some of the problems are:  Pain with movement and weight bearing.  Swelling and tenderness.  Buckling of the knee.  Inability to straighten or extend your knee.  Your knee locks and you cannot straighten it.  Warmth and redness with pain and fever.  Deformity or dislocation of the kneecap. DIAGNOSIS  Determining what is wrong may be very straight forward such as when there is an injury. It can also be challenging because of the complexity of the knee. Tests to make a diagnosis may include:  Your caregiver taking a history and doing a physical exam.  Routine X-rays can be used to rule out other problems. X-rays will not reveal a cartilage tear. Some injuries of the knee can be diagnosed by:  Arthroscopy a surgical technique by which a small video camera is inserted through tiny incisions on the sides of the knee. This procedure is used to examine and repair internal knee joint problems. Tiny instruments can be used during arthroscopy to repair the torn knee cartilage (meniscus).  Arthrography  is a radiology technique. A contrast liquid is directly injected into the knee joint. Internal structures of the knee joint then become visible on X-ray film.  An MRI scan is a non x-ray radiology procedure in which magnetic fields and a computer produce two- or three-dimensional images of the inside of the knee. Cartilage tears are often visible using an MRI scanner. MRI scans have largely replaced arthrography in diagnosing cartilage tears of the knee.  Blood work.  Examination of the fluid that helps to lubricate the knee joint (synovial fluid). This is done by taking a sample out using a needle and a syringe. TREATMENT The treatment of knee problems depends on the cause. Some of these treatments are:  Depending on the injury, proper casting, splinting, surgery or physical therapy care will be needed.  Give yourself adequate recovery time. Do not overuse your joints. If you begin to get sore during workout routines, back off. Slow down or do fewer repetitions.  For repetitive activities such as cycling or running, maintain your strength and nutrition.  Alternate muscle groups. For example if you are a weight lifter, work the upper body on one day and the lower body the next.  Either tight or weak muscles do not give the proper support for your knee. Tight or weak muscles do not absorb the stress placed on the knee joint. Keep the muscles surrounding the knee strong.  Take care of mechanical problems.  If you have flat feet, orthotics or special shoes may help.   See your caregiver if you need help.  Arch supports, sometimes with wedges on the inner or outer aspect of the heel, can help. These can shift pressure away from the side of the knee most bothered by osteoarthritis.  A brace called an "unloader" brace also may be used to help ease the pressure on the most arthritic side of the knee.  If your caregiver has prescribed crutches, braces, wraps or ice, use as directed. The acronym for  this is PRICE. This means protection, rest, ice, compression and elevation.  Nonsteroidal anti-inflammatory drugs (NSAID's), can help relieve pain. But if taken immediately after an injury, they may actually increase swelling. Take NSAID's with food in your stomach. Stop them if you develop stomach problems. Do not take these if you have a history of ulcers, stomach pain or bleeding from the bowel. Do not take without your caregiver's approval if you have problems with fluid retention, heart failure, or kidney problems.  For ongoing knee problems, physical therapy may be helpful.  Glucosamine and chondroitin are over-the-counter dietary supplements. Both may help relieve the pain of osteoarthritis in the knee. These medicines are different from the usual anti-inflammatory drugs. Glucosamine may decrease the rate of cartilage destruction.  Injections of a corticosteroid drug into your knee joint may help reduce the symptoms of an arthritis flare-up. They may provide pain relief that lasts a few months. You may have to wait a few months between injections. The injections do have a small increased risk of infection, water retention and elevated blood sugar levels.  Hyaluronic acid injected into damaged joints may ease pain and provide lubrication. These injections may work by reducing inflammation. A series of shots may give relief for as long as 6 months.  Topical painkillers. Applying certain ointments to your skin may help relieve the pain and stiffness of osteoarthritis. Ask your pharmacist for suggestions. Many over the-counter products are approved for temporary relief of arthritis pain.  In some countries, doctors often prescribe topical NSAID's for relief of chronic conditions such as arthritis and tendinitis. A review of treatment with NSAID creams found that they worked as well as oral medications but without the serious side effects. PREVENTION  Maintain a healthy weight. Extra pounds put  more strain on your joints.  Get strong, stay limber. Weak muscles are a common cause of knee injuries. Stretching is important. Include flexibility exercises in your workouts.  Be smart about exercise. If you have osteoarthritis, chronic knee pain or recurring injuries, you may need to change the way you exercise. This does not mean you have to stop being active. If your knees ache after jogging or playing basketball, consider switching to swimming, water aerobics or other low-impact activities, at least for a few days a week. Sometimes limiting high-impact activities will provide relief.  Make sure your shoes fit well. Choose footwear that is right for your sport.  Protect your knees. Use the proper gear for knee-sensitive activities. Use kneepads when playing volleyball or laying carpet. Buckle your seat belt every time you drive. Most shattered kneecaps occur in car accidents.  Rest when you are tired. SEEK MEDICAL CARE IF:  You have knee pain that is continual and does not seem to be getting better.  SEEK IMMEDIATE MEDICAL CARE IF:  Your knee joint feels hot to the touch and you have a high fever. MAKE SURE YOU:   Understand these instructions.  Will watch your condition.  Will get help right away if you are not   doing well or get worse. Document Released: 08/07/2007 Document Revised: 01/02/2012 Document Reviewed: 08/07/2007 [employer] Patient Information 2013 [employer].  

## 2013-02-11 NOTE — Progress Notes (Signed)
Subjective:    Patient ID: Travis Frames., male    DOB: Feb 18, 1961, 52 y.o.   MRN: [id number]  HPI  Pt presents to the clinic today with c/o left knee pain. This has been a chronic issue for him. The pain is intermittent 5/10. 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 He has had an arthroscopy of that knee in the past. He was seen for the same thing 1 month ago by Dr. Artist Pais. He was started on Celebrex which helps. He did not get the xray that Dr. Artist Pais had ordered. He would like referral back to an orthopedist.  Review of Systems      Past Medical History  Diagnosis Date  . Abnormal glucose   . Palpitations   . Abnormal partial thromboplastin time (PTT)   . Lupus anticoagulant positive     but negative for antiphosolipid antibody and anti B2 glycoprotein antibody    Current Outpatient Prescriptions  Medication Sig Dispense Refill  . celecoxib (CELEBREX) 200 MG capsule Take 1 capsule (200 mg total) by mouth 2 (two) times daily.  12 capsule  0  . HYDROcodone-acetaminophen (NORCO/VICODIN) 5-325 MG per tablet Take 1 tablet by mouth every 6 (six) hours as needed for pain.  30 tablet  0   No current facility-administered medications for this visit.    Allergies  Allergen Reactions  . Zegerid (Omeprazole-Sodium Bicarbonate) Anaphylaxis    Rash, lip and hand swelling, itching , throat swelling    Family History  Problem Relation Age of Onset  . Hypertension Mother   . Hypertension Father     History   Social History  . Marital Status: Divorced    Spouse Name: N/A    Number of Children: N/A  . Years of Education: N/A   Occupational History  . Not on file.   Social History Main Topics  . Smoking status: Former Smoker    Quit date: 05/24/2012  . Smokeless tobacco: Not on file  . Alcohol Use: Yes  . Drug Use: No  . Sexually Active: Not on file   Other Topics Concern  . Not on file   Social History Narrative  . No  narrative on file     Constitutional: Denies fever, malaise, fatigue, headache or abrupt weight changes.  Musculoskeletal: Pt reports left knee pain. Denies decrease in range of motion, difficulty with gait, muscle pain and swelling.  Neurological: Denies dizziness, difficulty with memory, difficulty with speech or problems with balance and coordination.   No other specific complaints in a complete review of systems (except as listed in HPI above).  Objective:   Physical Exam   BP 140/92  Pulse 89  Temp(Src) 98.2 F (36.8 C) (Oral)  Ht 5\' 6"  (1.676 m)  Wt 178 lb (80.74 kg)  BMI 28.74 kg/m2  SpO2 97% Wt Readings from Last 3 Encounters:  02/11/13 178 lb (80.74 kg)  01/11/13 174 lb (78.926 kg)  06/11/12 169 lb (76.658 kg)    General: Appears her stated age, well developed, well nourished in NAD. Cardiovascular: Normal rate and rhythm. S1,S2 noted.  No murmur, rubs or gallops noted. No JVD or BLE edema. No carotid bruits noted. Pulmonary/Chest: Normal effort and positive vesicular breath sounds. No respiratory distress. No wheezes, rales or ronchi noted. Musculoskeletal: Normal range of motion. No signs of joint swelling. No difficulty with gait.  Neurological: Alert and oriented. Cranial nerves II-XII intact. Coordination normal. +  DTRs bilaterally.       Assessment & Plan:  Left knee pain, chronic:  Continue celebrex eRx for vicodin Referral to ortho for further evaluation

## 2013-02-12 ENCOUNTER — Encounter: Payer: Self-pay | Admitting: Internal Medicine

## 2013-02-15 ENCOUNTER — Encounter: Payer: Federal, State, Local not specified - PPO | Admitting: Internal Medicine

## 2013-03-05 ENCOUNTER — Encounter: Payer: Self-pay | Admitting: Internal Medicine

## 2013-03-05 ENCOUNTER — Ambulatory Visit (INDEPENDENT_AMBULATORY_CARE_PROVIDER_SITE_OTHER): Payer: Federal, State, Local not specified - PPO | Admitting: Internal Medicine

## 2013-03-05 VITALS — BP 124/80 | HR 92 | Temp 98.2°F | Ht 68.0 in | Wt 175.0 lb

## 2013-03-05 DIAGNOSIS — Z0000 Encounter for general adult medical examination without abnormal findings: Secondary | ICD-10-CM

## 2013-03-05 DIAGNOSIS — Z23 Encounter for immunization: Secondary | ICD-10-CM

## 2013-03-05 MED ORDER — VARENICLINE TARTRATE 1 MG PO TABS: ORAL_TABLET | ORAL | Status: DC

## 2013-03-05 NOTE — Assessment & Plan Note (Signed)
Reviewed adult health maintenance protocols.  Unfortunately patient has restarted smoking. Use Chantix as directed. He has successfully quit in the past. Digital rectal exam is normal.   Lab Results  Component Value Date   PSA 0.37 01/30/2013   PSA 0.30 12/02/2011   PSA 0.27 12/02/2010    Patient updated with Tdap.  I encouraged regular exercise and low saturated fat diet.

## 2013-03-05 NOTE — Progress Notes (Signed)
Subjective:    Patient ID: Travis Frames., male    DOB: 1961-05-17, 52 y.o.   MRN: [id number]  HPI  52 year old [place] American male for routine physical. He denies any significant interval medical history. Patient quit smoking in the past unfortunately he has resumed smoking mainly after work. He averages 3-4 cigarettes at night.  Patient's weight is stable. He has no history of type 2 diabetes. He follows fairly healthy diet. Screening lab results reviewed.  Review of Systems  Constitutional: Negative for activity change, appetite change and unexpected weight change.  Eyes: Negative for visual disturbance.  Respiratory: Negative for cough, chest tightness and shortness of breath.   Cardiovascular: Negative for chest pain.  Genitourinary: Negative for difficulty urinating.  Neurological: Negative for headaches.  Gastrointestinal: Negative for abdominal pain, heartburn melena or hematochezia Psych: Negative for depression or anxiety         Past Medical History  Diagnosis Date  . Abnormal glucose   . Palpitations   . Abnormal partial thromboplastin time (PTT)   . Lupus anticoagulant positive     but negative for antiphosolipid antibody and anti B2 glycoprotein antibody    History   Social History  . Marital Status: Divorced    Spouse Name: N/A    Number of Children: N/A  . Years of Education: N/A   Occupational History  . Not on file.   Social History Main Topics  . Smoking status: Former Smoker    Quit date: 05/24/2012  . Smokeless tobacco: Not on file  . Alcohol Use: Yes  . Drug Use: No  . Sexually Active: Not on file   Other Topics Concern  . Not on file   Social History Narrative  . No narrative on file    No past surgical history on file.  Family History  Problem Relation Age of Onset  . Hypertension Mother   . Hypertension Father     Allergies  Allergen Reactions  . Zegerid (Omeprazole-Sodium Bicarbonate) Anaphylaxis    Rash, lip and  hand swelling, itching , throat swelling    Current Outpatient Prescriptions on File Prior to Visit  Medication Sig Dispense Refill  . celecoxib (CELEBREX) 200 MG capsule Take 1 capsule (200 mg total) by mouth 2 (two) times daily.  12 capsule  0   No current facility-administered medications on file prior to visit.    BP 124/80  Pulse 92  Temp(Src) 98.2 F (36.8 C) (Oral)  Ht 5\' 8"  (1.727 m)  Wt 175 lb (79.379 kg)  BMI 26.61 kg/m2    Objective:   Physical Exam  Constitutional: He appears well-developed and well-nourished.  HENT:  Head: Normocephalic and atraumatic.  Right Ear: External ear normal.  Left Ear: External ear normal.  Mouth/Throat: Oropharynx is clear and moist.  Eyes: EOM are normal. Pupils are equal, round, and reactive to light. No scleral icterus.  Neck: Neck supple.  No carotid bruit  Cardiovascular: Normal rate, regular rhythm, normal heart sounds and intact distal pulses.   No murmur heard. Pulmonary/Chest: Effort normal and breath sounds normal. He has no wheezes.  Abdominal: Soft. Bowel sounds are normal. There is no tenderness.  Genitourinary: Rectum normal and prostate normal. Guaiac negative stool.  Musculoskeletal: Normal range of motion. He exhibits no edema.  Lymphadenopathy:    He has no cervical adenopathy.  Neurological: He is alert. No cranial nerve deficit. Coordination normal.  Skin: Skin is warm and dry.  Psychiatric: He has a normal mood and affect.  His behavior is normal.          Assessment & Plan:

## 2013-05-30 ENCOUNTER — Ambulatory Visit (INDEPENDENT_AMBULATORY_CARE_PROVIDER_SITE_OTHER): Payer: Federal, State, Local not specified - PPO | Admitting: Family Medicine

## 2013-05-30 ENCOUNTER — Encounter: Payer: Self-pay | Admitting: Family Medicine

## 2013-05-30 VITALS — BP 132/78 | Temp 98.7°F | Wt 176.0 lb

## 2013-05-30 DIAGNOSIS — K5289 Other specified noninfective gastroenteritis and colitis: Secondary | ICD-10-CM

## 2013-05-30 DIAGNOSIS — K529 Noninfective gastroenteritis and colitis, unspecified: Secondary | ICD-10-CM

## 2013-05-30 DIAGNOSIS — M25569 Pain in unspecified knee: Secondary | ICD-10-CM

## 2013-05-30 MED ORDER — ONDANSETRON HCL 4 MG PO TABS: 4.0000 mg | ORAL_TABLET | Freq: Three times a day (TID) | ORAL | Status: DC | PRN

## 2013-05-30 MED ORDER — CELECOXIB 200 MG PO CAPS: 200.0000 mg | ORAL_CAPSULE | Freq: Two times a day (BID) | ORAL | Status: DC

## 2013-05-30 NOTE — Patient Instructions (Signed)
1) for stomach bug -plenty of fluids -zofran if needed per instructions for nausea and vomiting -imodium if needed for diarrhea per instructions -no dairy for one week  2)For knee pain: -gentle regular exercise, cycling and swimming are good -celebrex sparingly if needed short term -follow up with ortho if needed

## 2013-05-30 NOTE — Progress Notes (Signed)
Chief Complaint  Patient presents with  . Knee Pain    left; celebrex  . stomach bug    HPI:  52 yo pt of Dr. Artist Pais here for acute visit for:  1) Stomach Bug: -started: yesterday -symptoms: nausea, vomiting, diarrhea - daughter has the same symptoms -denies: fevers, chills, hematochezia, melena  2)Knee Pain: -chronic L knee pain per [facility] and referred to ortho for this -saw ortho - they did imaging, told him a little OA -using celebrex when needs to and advil - uses celebrex sparingly for this and wants rx for this -denies: fevers, chills, redness, weakness  ROS: See pertinent positives and negatives per HPI.  Past Medical History  Diagnosis Date  . Abnormal glucose   . Palpitations   . Abnormal partial thromboplastin time (PTT)   . Lupus anticoagulant positive     but negative for antiphosolipid antibody and anti B2 glycoprotein antibody    Family History  Problem Relation Age of Onset  . Hypertension Mother   . Hypertension Father     History   Social History  . Marital Status: Divorced    Spouse Name: N/A    Number of Children: N/A  . Years of Education: N/A   Social History Main Topics  . Smoking status: Current Some Day Smoker -- 0.25 packs/day    Types: Cigarettes    Last Attempt to Quit: 05/24/2012  . Smokeless tobacco: None  . Alcohol Use: Yes  . Drug Use: No  . Sexually Active: None   Other Topics Concern  . None   Social History Narrative  . None    Current outpatient prescriptions:celecoxib (CELEBREX) 200 MG capsule, Take 1 capsule (200 mg total) by mouth 2 (two) times daily., Disp: 30 capsule, Rfl: 0;  varenicline (CHANTIX) 1 MG tablet, Take 1/2 tab twice daily, Disp: 30 tablet, Rfl: 5;  ondansetron (ZOFRAN) 4 MG tablet, Take 1 tablet (4 mg total) by mouth every 8 (eight) hours as needed for nausea., Disp: 20 tablet, Rfl: 0  EXAM:  Filed Vitals:   05/30/13 1329  BP: 132/78  Temp: 98.7 F (37.1 C)    Body mass index is 26.77  kg/(m^2).  GENERAL: vitals reviewed and listed above, alert, oriented, appears well hydrated and in no acute distress  HEENT: atraumatic, conjunttiva clear, no obvious abnormalities on inspection of external nose and ears  NECK: no obvious masses on inspection  LUNGS: clear to auscultation bilaterally, no wheezes, rales or rhonchi, good air movement  CV: HRRR, no peripheral edema  ABD: BS+, soft, NTTP, no rebound or guarding  MS: moves all extremities without noticeable abnormality -normal gait -no swelling or erythema of joints in LE -mild medial jt line TTP, otherwise normal exam of knees  PSYCH: pleasant and cooperative, no obvious depression or anxiety  ASSESSMENT AND PLAN:  Discussed the following assessment and plan:  Gastroenteritis - Plan: ondansetron (ZOFRAN) 4 MG tablet  Knee pain, unspecified laterality - Plan: celecoxib (CELEBREX) 200 MG capsule  -Patient advised to return or notify a doctor immediately if symptoms worsen or persist or new concerns arise.  Patient Instructions  1) for stomach bug -plenty of fluids -zofran if needed per instructions for nausea and vomiting -imodium if needed for diarrhea per instructions -no dairy for one week  2)For knee pain: -gentle regular exercise, cycling and swimming are good -celebrex sparingly if needed short term -follow up with ortho if needed     Lauria Depoy R.

## 2013-07-27 ENCOUNTER — Encounter: Payer: Self-pay | Admitting: Family Medicine

## 2013-07-27 ENCOUNTER — Ambulatory Visit (INDEPENDENT_AMBULATORY_CARE_PROVIDER_SITE_OTHER): Payer: Federal, State, Local not specified - PPO | Admitting: Family Medicine

## 2013-07-27 VITALS — BP 142/80 | HR 70 | Temp 98.2°F | Wt 177.8 lb

## 2013-07-27 DIAGNOSIS — K529 Noninfective gastroenteritis and colitis, unspecified: Secondary | ICD-10-CM | POA: Insufficient documentation

## 2013-07-27 DIAGNOSIS — K5289 Other specified noninfective gastroenteritis and colitis: Secondary | ICD-10-CM

## 2013-07-27 DIAGNOSIS — J020 Streptococcal pharyngitis: Secondary | ICD-10-CM

## 2013-07-27 DIAGNOSIS — M25569 Pain in unspecified knee: Secondary | ICD-10-CM

## 2013-07-27 MED ORDER — ONDANSETRON HCL 4 MG PO TABS: 4.0000 mg | ORAL_TABLET | Freq: Three times a day (TID) | ORAL | Status: DC | PRN

## 2013-07-27 MED ORDER — CELECOXIB 200 MG PO CAPS: 200.0000 mg | ORAL_CAPSULE | Freq: Two times a day (BID) | ORAL | Status: DC

## 2013-07-27 NOTE — Patient Instructions (Addendum)
Sips of fluids, wash your hands.  Get some rest.  Notify your doc if not improved.  Don't take the celebrex until you are feeling better.  Use zofran for nausea.  Out of work in the meantime.  Take care.

## 2013-07-27 NOTE — Assessment & Plan Note (Signed)
Nontoxic, but out of work for now as likely contagious.  Path/phys d/w pt.  Continue PO fluids.  zofran prn.  I refilled his celebrex for now, to allow him to f/u prn with PCP.  Advised not to use celebrex until AGE resolved.

## 2013-07-27 NOTE — Progress Notes (Signed)
Vomiting and diarrhea.  Started yesterday.  No blood in stool or vomit. HA but no fever.  20+ episodes of diarrhea.  Mult sick contacts with similar.  No travel or atypical foods.  No aches, no rash.  Still with normal UOP.    Asked about a refill on celebrex for knee pain.  It had helped with pain prev w/o ADE.    Meds, vitals, and allergies reviewed.   ROS: See HPI.  Otherwise, noncontributory.  nad ncat Mmm rrr ctab abd soft, not ttp, +BS Ext w/o edema

## 2013-08-19 ENCOUNTER — Telehealth: Payer: Self-pay | Admitting: *Deleted

## 2013-08-19 NOTE — Telephone Encounter (Signed)
Received prior auth request from pharmacy for Celebrex 200 mg. I did prior auth online using information provided in pt's medical record. Medication was approved from 06/14/13-08/14/14. Prior auth paperwork will be sent to scan into pt's medical record.

## 2013-11-22 ENCOUNTER — Encounter: Payer: Self-pay | Admitting: *Deleted

## 2013-11-22 ENCOUNTER — Telehealth: Payer: Self-pay | Admitting: Internal Medicine

## 2013-11-22 NOTE — Telephone Encounter (Addendum)
Pt needs on letter head his appointment for [facility] labs on 02-28-2014 and [facility] on 03-07-14. Pt works for [employer]. Pt would like letter to be mail to him

## 2013-11-22 NOTE — Telephone Encounter (Signed)
Letter mailed

## 2014-01-06 ENCOUNTER — Ambulatory Visit (INDEPENDENT_AMBULATORY_CARE_PROVIDER_SITE_OTHER): Payer: Federal, State, Local not specified - PPO | Admitting: Family Medicine

## 2014-01-06 ENCOUNTER — Encounter: Payer: Self-pay | Admitting: Family Medicine

## 2014-01-06 VITALS — BP 130/80 | HR 90 | Temp 98.0°F | Wt 179.0 lb

## 2014-01-06 DIAGNOSIS — J069 Acute upper respiratory infection, unspecified: Secondary | ICD-10-CM

## 2014-01-06 DIAGNOSIS — B9789 Other viral agents as the cause of diseases classified elsewhere: Principal | ICD-10-CM

## 2014-01-06 NOTE — Progress Notes (Signed)
Pre visit review using our clinic review tool, if applicable. No additional management support is needed unless otherwise documented below in the visit note. 

## 2014-01-06 NOTE — Progress Notes (Signed)
   Subjective:    Patient ID: Travis FramesJohn L Bowdoin Jr., male    DOB: [birth year], 53 y.o.   MRN: [id number]  Cough Associated symptoms include headaches and myalgias. Pertinent negatives include no chest pain, chills, fever or sore throat.   Patient seen with onset about 3 days of body aches, headaches, chills, and mostly nonproductive cough. Has taken TheraFlu with some relief. He thinks he may have some low-grade fever over the weekend but none now. Denies any nausea vomiting. Slightly diminished appetite. Denies any sore throat. He was Travis RilingRussell coworkers have similar symptoms. He is a nonsmoker.  Past Medical History  Diagnosis Date  . Abnormal glucose   . Palpitations   . Abnormal partial thromboplastin time (PTT)   . Lupus anticoagulant positive     but negative for antiphosolipid antibody and anti B2 glycoprotein antibody   No past surgical history on file.  reports that he has been smoking Cigarettes.  He has been smoking about 0.25 packs per day. He does not have any smokeless tobacco history on file. He reports that he drinks alcohol. He reports that he does not use illicit drugs. family history includes Hypertension in his father and mother. Allergies  Allergen Reactions  . Zegerid [Omeprazole-Sodium Bicarbonate] Anaphylaxis    Rash, lip and hand swelling, itching , throat swelling      Review of Systems  Constitutional: Negative for fever and chills.  HENT: Positive for congestion. Negative for sore throat.   Respiratory: Positive for cough.   Cardiovascular: Negative for chest pain.  Musculoskeletal: Positive for myalgias.  Neurological: Positive for headaches.       Objective:   Physical Exam  Constitutional: He appears well-developed and well-nourished.  HENT:  Right Ear: External ear normal.  Left Ear: External ear normal.  Mouth/Throat: Oropharynx is clear and moist.  Neck: Neck supple.  Cardiovascular: Normal rate.   Pulmonary/Chest: Breath sounds normal. No  respiratory distress. He has no wheezes. He has no rales.  Lymphadenopathy:    He has no cervical adenopathy.          Assessment & Plan:  Viral syndrome. Nonfocal exam. Reassurance. Try over-the-counter cough medications as needed. Work note written for today and tomorrow

## 2014-01-06 NOTE — Patient Instructions (Signed)

## 2014-02-12 ENCOUNTER — Encounter: Payer: Self-pay | Admitting: *Deleted

## 2014-02-12 ENCOUNTER — Telehealth: Payer: Self-pay | Admitting: Internal Medicine

## 2014-02-12 NOTE — Telephone Encounter (Signed)
Letter printed and mailed per pt request.

## 2014-02-12 NOTE — Telephone Encounter (Signed)
Pt had to change his appointment to 03/19/14 due to dr. Artist Paisyoo not in office on 5/15. Pt states he needs a note from dr. Artist Paisyoo for his job explaining the appointment being changed.

## 2014-02-27 ENCOUNTER — Other Ambulatory Visit: Payer: Self-pay | Admitting: *Deleted

## 2014-02-27 DIAGNOSIS — Z0000 Encounter for general adult medical examination without abnormal findings: Secondary | ICD-10-CM

## 2014-02-28 ENCOUNTER — Other Ambulatory Visit: Payer: Federal, State, Local not specified - PPO

## 2014-03-07 ENCOUNTER — Encounter: Payer: Federal, State, Local not specified - PPO | Admitting: Internal Medicine

## 2014-03-19 ENCOUNTER — Encounter: Payer: Federal, State, Local not specified - PPO | Admitting: Internal Medicine

## 2014-03-19 DIAGNOSIS — Z0289 Encounter for other administrative examinations: Secondary | ICD-10-CM

## 2014-05-13 ENCOUNTER — Ambulatory Visit (INDEPENDENT_AMBULATORY_CARE_PROVIDER_SITE_OTHER): Payer: Federal, State, Local not specified - PPO | Admitting: Physician Assistant

## 2014-05-13 ENCOUNTER — Encounter: Payer: Self-pay | Admitting: Physician Assistant

## 2014-05-13 VITALS — BP 138/88 | HR 78 | Temp 98.7°F | Resp 18 | Wt 175.0 lb

## 2014-05-13 DIAGNOSIS — A088 Other specified intestinal infections: Secondary | ICD-10-CM

## 2014-05-13 DIAGNOSIS — A084 Viral intestinal infection, unspecified: Secondary | ICD-10-CM

## 2014-05-13 LAB — BASIC METABOLIC PANEL
BUN: 16 mg/dL (ref 6–23)
CHLORIDE: 108 meq/L (ref 96–112)
CO2: 27 mEq/L (ref 19–32)
Calcium: 9.5 mg/dL (ref 8.4–10.5)
Creatinine, Ser: 1.1 mg/dL (ref 0.4–1.5)
GFR: 92.13 mL/min (ref >60.00)
Glucose, Bld: 120 mg/dL — ABNORMAL HIGH (ref 70–99)
POTASSIUM: 4.2 meq/L (ref 3.5–5.1)
Sodium: 140 mEq/L (ref 135–145)

## 2014-05-13 LAB — CBC WITH DIFFERENTIAL/PLATELET
BASOS PCT: 0.3 % (ref 0.0–3.0)
Basophils Absolute: 0 10*3/uL (ref 0.0–0.1)
Eosinophils Absolute: 0.2 10*3/uL (ref 0.0–0.7)
Eosinophils Relative: 2.9 % (ref 0.0–5.0)
HCT: 42.5 % (ref 39.0–52.0)
Hemoglobin: 14.3 g/dL (ref 13.0–17.0)
LYMPHS PCT: 24.8 % (ref 12.0–46.0)
Lymphs Abs: 2 10*3/uL (ref 0.7–4.0)
MCHC: 33.5 g/dL (ref 30.0–36.0)
MCV: 95.1 fl (ref 78.0–100.0)
MONOS PCT: 10.3 % (ref 3.0–12.0)
Monocytes Absolute: 0.9 10*3/uL (ref 0.1–1.0)
NEUTROS PCT: 61.7 % (ref 43.0–77.0)
Neutro Abs: 5.1 10*3/uL (ref 1.4–7.7)
Platelets: 384 10*3/uL (ref 150.0–400.0)
RBC: 4.47 Mil/uL (ref 4.22–5.81)
RDW: 14.1 % (ref 11.5–15.5)
WBC: 8.2 10*3/uL (ref 4.0–10.5)

## 2014-05-13 MED ORDER — PROMETHAZINE HCL 12.5 MG PO TABS: 12.5000 mg | ORAL_TABLET | Freq: Three times a day (TID) | ORAL | Status: DC | PRN

## 2014-05-13 NOTE — Patient Instructions (Signed)
We will call you with your lab results when available.  Phenergan every 8 hours as needed for nausea, NO DRIVING WHILE TAKING AS THIS WILL MAKE YOU DROWSY.  Take plain over the counter Imodium to help relieve diarrhea symptoms.  Stay hydrated by drinking water and gatorade. For severe nausea, a cap full of gatorade every few minutes is usually enough to maintain hydration without inducing vomiting, which could happen from chugging fluids.  If emergency symptoms discussed during visit developed, seek medical attention immediately.  Followup as needed, or for worsening or persistent symptoms despite treatment.   Viral Gastroenteritis Viral gastroenteritis is also called stomach flu. This illness is caused by a certain type of germ (virus). It can cause sudden watery poop (diarrhea) and throwing up (vomiting). This can cause you to lose body fluids (dehydration). This illness usually lasts for 3 to 8 days. It usually goes away on its own. HOME CARE   Drink enough fluids to keep your pee (urine) clear or pale yellow. Drink small amounts of fluids often.  Ask your doctor how to replace body fluid losses (rehydration).  Avoid:  Foods high in sugar.  Alcohol.  Bubbly (carbonated) drinks.  Tobacco.  Juice.  Caffeine drinks.  Very hot or cold fluids.  Fatty, greasy foods.  Eating too much at one time.  Dairy products until 24 to 48 hours after your watery poop stops.  You may eat foods with active cultures (probiotics). They can be found in some yogurts and supplements.  Wash your hands well to avoid spreading the illness.  Only take medicines as told by your doctor. Do not give aspirin to children. Do not take medicines for watery poop (antidiarrheals).  Ask your doctor if you should keep taking your regular medicines.  Keep all doctor visits as told. GET HELP RIGHT AWAY IF:   You cannot keep fluids down.  You do not pee at least once every 6 to 8 hours.  You are  short of breath.  You see blood in your poop or throw up. This may look like coffee grounds.  You have belly (abdominal) pain that gets worse or is just in one small spot (localized).  You keep throwing up or having watery poop.  You have a fever.  The patient is a child younger than 3 months, and he or she has a fever.  The patient is a child older than 3 months, and he or she has a fever and problems that do not go away.  The patient is a child older than 3 months, and he or she has a fever and problems that suddenly get worse.  The patient is a baby, and he or she has no tears when crying. MAKE SURE YOU:   Understand these instructions.  Will watch your condition.  Will get help right away if you are not doing well or get worse. Document Released: 03/28/2008 Document Revised: 01/02/2012 Document Reviewed: 07/27/2011 [facility] Patient Information 2015 [employer] [place] [place]. This information is not intended to replace advice given to you by your health care provider. Make sure you discuss any questions you have with your health care provider. Food Choices to Help Relieve Diarrhea When you have diarrhea, the foods you eat and your eating habits are very important. Choosing the right foods and drinks can help relieve diarrhea. Also, because diarrhea can last up to 7 days, you need to replace lost fluids and electrolytes (such as sodium, potassium, and chloride) in order to help prevent dehydration.  WHAT GENERAL GUIDELINES DO I NEED TO FOLLOW?  Slowly drink 1 cup (8 oz) of fluid for each episode of diarrhea. If you are getting enough fluid, your urine will be clear or pale yellow.  Eat starchy foods. Some good choices include white rice, white toast, pasta, low-fiber cereal, baked potatoes (without the skin), saltine crackers, and bagels.  Avoid large servings of any cooked vegetables.  Limit fruit to two servings per day. A serving is  cup or 1 small piece.  Choose foods with less  than 2 g of fiber per serving.  Limit fats to less than 8 tsp (38 g) per day.  Avoid fried foods.  Eat foods that have probiotics in them. Probiotics can be found in certain dairy products.  Avoid foods and beverages that may increase the speed at which food moves through the stomach and intestines (gastrointestinal tract). Things to avoid include:  High-fiber foods, such as dried fruit, raw fruits and vegetables, nuts, seeds, and whole grain foods.  Spicy foods and high-fat foods.  Foods and beverages sweetened with high-fructose corn syrup, honey, or sugar alcohols such as xylitol, sorbitol, and mannitol. WHAT FOODS ARE RECOMMENDED? Grains White rice. White, [place], or pita breads (fresh or toasted), including plain rolls, buns, or bagels. White pasta. Saltine, soda, or graham crackers. Pretzels. Low-fiber cereal. Cooked cereals made with water (such as cornmeal, farina, or cream cereals). Plain muffins. Matzo. Melba toast. Zwieback.  Vegetables Potatoes (without the skin). Strained tomato and vegetable juices. Most well-cooked and canned vegetables without seeds. Tender lettuce. Fruits Cooked or canned applesauce, apricots, cherries, fruit cocktail, grapefruit, peaches, pears, or plums. Fresh bananas, apples without skin, cherries, grapes, cantaloupe, grapefruit, peaches, oranges, or plums.  Meat and Other Protein Products Baked or boiled chicken. Eggs. Tofu. Fish. Seafood. Smooth peanut butter. Ground or well-cooked tender beef, ham, veal, lamb, pork, or poultry.  Dairy Plain yogurt, kefir, and unsweetened liquid yogurt. Lactose-free milk, buttermilk, or soy milk. Plain hard cheese. Beverages Sport drinks. Clear broths. Diluted fruit juices (except prune). Regular, caffeine-free sodas such as ginger ale. Water. Decaffeinated teas. Oral rehydration solutions. Sugar-free beverages not sweetened with sugar alcohols. Other Bouillon, broth, or soups made from recommended foods.  The  items listed above may not be a complete list of recommended foods or beverages. Contact your dietitian for more options. WHAT FOODS ARE NOT RECOMMENDED? Grains Whole grain, whole wheat, bran, or rye breads, rolls, pastas, crackers, and cereals. Wild or brown rice. Cereals that contain more than 2 g of fiber per serving. Corn tortillas or taco shells. Cooked or dry oatmeal. Granola. Popcorn. Vegetables Raw vegetables. Cabbage, broccoli, Brussels sprouts, artichokes, baked beans, beet greens, corn, kale, legumes, peas, sweet potatoes, and yams. Potato skins. Cooked spinach and cabbage. Fruits Dried fruit, including raisins and dates. Raw fruits. Stewed or dried prunes. Fresh apples with skin, apricots, mangoes, pears, raspberries, and strawberries.  Meat and Other Protein Products Chunky peanut butter. Nuts and seeds. Beans and lentils. Tomasa Blase.  Dairy High-fat cheeses. Milk, chocolate milk, and beverages made with milk, such as milk shakes. Cream. Ice cream. Sweets and Desserts Sweet rolls, doughnuts, and sweet breads. Pancakes and waffles. Fats and Oils Butter. Cream sauces. Margarine. Salad oils. Plain salad dressings. Olives. Avocados.  Beverages Caffeinated beverages (such as coffee, tea, soda, or energy drinks). Alcoholic beverages. Fruit juices with pulp. Prune juice. Soft drinks sweetened with high-fructose corn syrup or sugar alcohols. Other Coconut. Hot sauce. Chili powder. Mayonnaise. Gravy. Cream-based or milk-based soups.  The  items listed above may not be a complete list of foods and beverages to avoid. Contact your dietitian for more information. WHAT SHOULD I DO IF I BECOME DEHYDRATED? Diarrhea can sometimes lead to dehydration. Signs of dehydration include dark urine and dry mouth and skin. If you think you are dehydrated, you should rehydrate with an oral rehydration solution. These solutions can be purchased at pharmacies, retail stores, or online.  Drink -1 cup (120-240 mL)  of oral rehydration solution each time you have an episode of diarrhea. If drinking this amount makes your diarrhea worse, try drinking smaller amounts more often. For example, drink 1-3 tsp (5-15 mL) every 5-10 minutes.  A general rule for staying hydrated is to drink 1-2 L of fluid per day. Talk to your health care provider about the specific amount you should be drinking each day. Drink enough fluids to keep your urine clear or pale yellow. Document Released: 12/31/2003 Document Revised: 10/15/2013 Document Reviewed: 09/02/2013 [facility] Patient Information 2015 [place], [place]. This information is not intended to replace advice given to you by your health care provider. Make sure you discuss any questions you have with your health care provider.

## 2014-05-13 NOTE — Progress Notes (Signed)
Pre visit review using our clinic review tool, if applicable. No additional management support is needed unless otherwise documented below in the visit note. 

## 2014-05-13 NOTE — Progress Notes (Signed)
Called medication into pharmacy. 

## 2014-05-13 NOTE — Progress Notes (Signed)
Subjective:    Patient ID: Travis Rice., male    DOB: 04-12-1961, 53 y.o.   MRN: [id number]  Diarrhea  This is a new problem. The current episode started yesterday. The problem occurs 2 to 4 times per day. The problem has been unchanged. The stool consistency is described as watery. The patient states that diarrhea does not awaken him from sleep. Associated symptoms include chills, a fever, headaches and myalgias. Pertinent negatives include no abdominal pain, arthralgias, bloating, coughing, increased  flatus, sweats, URI, vomiting or weight loss. Nothing aggravates the symptoms. Risk factors include ill contacts (daughter had similar symptoms, virus from daycare.). Treatments tried: Pepto Bismol. The treatment provided mild relief. There is no history of bowel resection, inflammatory bowel disease, irritable bowel syndrome, malabsorption, a recent abdominal surgery or short gut syndrome.      Review of Systems  Constitutional: Positive for fever and chills. Negative for weight loss and appetite change.  Respiratory: Negative for cough and shortness of breath.   Cardiovascular: Negative for chest pain and palpitations.  Gastrointestinal: Positive for nausea and diarrhea. Negative for vomiting, abdominal pain, constipation, blood in stool, abdominal distention, anal bleeding, bloating and flatus.  Musculoskeletal: Positive for myalgias. Negative for arthralgias.  Neurological: Positive for headaches. Negative for syncope.  All other systems reviewed and are negative.     Past Medical History  Diagnosis Date  . Abnormal glucose   . Palpitations   . Abnormal partial thromboplastin time (PTT)   . Lupus anticoagulant positive     but negative for antiphosolipid antibody and anti B2 glycoprotein antibody    History   Social History  . Marital Status: Divorced    Spouse Name: N/A    Number of Children: N/A  . Years of Education: N/A   Occupational History  . Not on file.    Social History Main Topics  . Smoking status: Current Some Day Smoker -- 0.25 packs/day    Types: Cigarettes    Last Attempt to Quit: 05/24/2012  . Smokeless tobacco: Not on file  . Alcohol Use: Yes  . Drug Use: No  . Sexual Activity: Not on file   Other Topics Concern  . Not on file   Social History Narrative  . No narrative on file    No past surgical history on file.  Family History  Problem Relation Age of Onset  . Hypertension Mother   . Hypertension Father     Allergies  Allergen Reactions  . Zegerid [Omeprazole-Sodium Bicarbonate] Anaphylaxis    Rash, lip and hand swelling, itching , throat swelling    Current Outpatient Prescriptions on File Prior to Visit  Medication Sig Dispense Refill  . celecoxib (CELEBREX) 200 MG capsule Take 1 capsule (200 mg total) by mouth 2 (two) times daily.  30 capsule  0  . ondansetron (ZOFRAN) 4 MG tablet Take 1 tablet (4 mg total) by mouth every 8 (eight) hours as needed for nausea.  20 tablet  0   No current facility-administered medications on file prior to visit.    EXAM: BP 138/88  Pulse 78  Temp(Src) 98.7 F (37.1 C) (Oral)  Resp 18  Wt 175 lb (79.379 kg)     Objective:   Physical Exam  Nursing note and vitals reviewed. Constitutional: He is oriented to person, place, and time. He appears well-developed and well-nourished. No distress.  HENT:  Head: Normocephalic and atraumatic.  Mouth/Throat: Oropharynx is clear and moist. No oropharyngeal exudate.  Eyes: Conjunctivae  and EOM are normal. Pupils are equal, round, and reactive to light.  Cardiovascular: Normal rate, regular rhythm and intact distal pulses.   Pulmonary/Chest: Effort normal and breath sounds normal. No respiratory distress. He exhibits no tenderness.  Abdominal: Soft. Bowel sounds are normal. He exhibits no distension and no mass. There is tenderness (mild diffuse abdominal ttp). There is no rebound and no guarding.  Neurological: He is alert and  oriented to person, place, and time.  Skin: Skin is warm and dry. No rash noted. He is not diaphoretic. No erythema. No pallor.  Psychiatric: He has a normal mood and affect. His behavior is normal. Judgment and thought content normal.     Lab Results  Component Value Date   WBC 9.4 01/30/2013   HGB 14.2 01/30/2013   HCT 41.5 01/30/2013   PLT 480.0* 01/30/2013   GLUCOSE 103* 01/30/2013   CHOL 231* 01/30/2013   TRIG 103.0 01/30/2013   HDL 64.20 01/30/2013   LDLDIRECT 144.4 01/30/2013   ALT 25 01/30/2013   AST 17 01/30/2013   NA 141 01/30/2013   K 4.7 01/30/2013   CL 106 01/30/2013   CREATININE 1.2 01/30/2013   BUN 23 01/30/2013   CO2 26 01/30/2013   TSH 0.36 01/30/2013   PSA 0.37 01/30/2013   INR 0.8 06/23/2008   HGBA1C 6.2 12/02/2010        Assessment & Plan:  Jonny RuizJohn was seen today for diarrhea.  Diagnoses and associated orders for this visit:  Viral enteritis Comments: Will have pt use BRAT diet, stay hydrated with gatorade and water, use immodium for diarheal symptoms. Watchful waiting. - CBC with Differential - Basic Metabolic Panel - promethazine (PHENERGAN) 12.5 MG tablet; Take 1 tablet (12.5 mg total) by mouth every 8 (eight) hours as needed for nausea or vomiting.    Plan as per patient instructions.  Return precautions provided, and patient handout on viral gastroenteritis and BRAT diet.  Plan to follow up as needed, or for worsening or persistent symptoms despite treatment.  Patient Instructions  We will call you with your lab results when available.  Phenergan every 8 hours as needed for nausea, NO DRIVING WHILE TAKING AS THIS WILL MAKE YOU DROWSY.  Take plain over the counter Imodium to help relieve diarrhea symptoms.  Stay hydrated by drinking water and gatorade. For severe nausea, a cap full of gatorade every few minutes is usually enough to maintain hydration without inducing vomiting, which could happen from chugging fluids.  If emergency symptoms discussed during visit developed,  seek medical attention immediately.  Followup as needed, or for worsening or persistent symptoms despite treatment.

## 2014-07-25 ENCOUNTER — Ambulatory Visit (INDEPENDENT_AMBULATORY_CARE_PROVIDER_SITE_OTHER): Payer: Federal, State, Local not specified - PPO | Admitting: Family

## 2014-07-25 ENCOUNTER — Encounter: Payer: Self-pay | Admitting: Family

## 2014-07-25 VITALS — BP 120/78 | Temp 98.8°F | Wt 178.1 lb

## 2014-07-25 DIAGNOSIS — M1712 Unilateral primary osteoarthritis, left knee: Secondary | ICD-10-CM

## 2014-07-25 DIAGNOSIS — S39012A Strain of muscle, fascia and tendon of lower back, initial encounter: Secondary | ICD-10-CM

## 2014-07-25 DIAGNOSIS — M25569 Pain in unspecified knee: Secondary | ICD-10-CM

## 2014-07-25 MED ORDER — CYCLOBENZAPRINE HCL 10 MG PO TABS: 10.0000 mg | ORAL_TABLET | Freq: Three times a day (TID) | ORAL | Status: DC | PRN

## 2014-07-25 MED ORDER — CELECOXIB 200 MG PO CAPS: 200.0000 mg | ORAL_CAPSULE | Freq: Two times a day (BID) | ORAL | Status: DC

## 2014-07-25 NOTE — Patient Instructions (Signed)

## 2014-07-25 NOTE — Progress Notes (Signed)
Subjective:    Patient ID: Travis FramesJohn L Haskew Jr., male    DOB: [birth year], 53 y.o.   MRN: [id number]  HPI 53 year old [place] male, nonsmoker, patient of Dr. Caryl NeverBurchette is in today with complaints of low back pain after he fell yesterday and twisted his back. No direct injury to the back. Reports his left knee giving out causing him to fall. He has a history of left knee arthritis and has had arthroscopic surgery. Recent pain in his back as 6/10, worse with movement and described as achy. Has not taken any medication for relief. He is out of work today. He is a Advertising account plannermailman. Has not consult with orthopedics with regards to his left knee.   Review of Systems  Constitutional: Negative.   Respiratory: Negative.   Cardiovascular: Negative.   Endocrine: Negative.   Genitourinary: Negative.   Musculoskeletal: Positive for arthralgias and back pain.       Left knee pain. Low back pain.   Skin: Negative.   Allergic/Immunologic: Negative.   Neurological: Negative.   Psychiatric/Behavioral: Negative.    Past Medical History  Diagnosis Date  . Abnormal glucose   . Palpitations   . Abnormal partial thromboplastin time (PTT)   . Lupus anticoagulant positive     but negative for antiphosolipid antibody and anti B2 glycoprotein antibody    History   Social History  . Marital Status: Divorced    Spouse Name: N/A    Number of Children: N/A  . Years of Education: N/A   Occupational History  . Not on file.   Social History Main Topics  . Smoking status: Current Some Day Smoker -- 0.25 packs/day    Types: Cigarettes    Last Attempt to Quit: 05/24/2012  . Smokeless tobacco: Not on file  . Alcohol Use: Yes  . Drug Use: No  . Sexual Activity: Not on file   Other Topics Concern  . Not on file   Social History Narrative  . No narrative on file    History reviewed. No pertinent past surgical history.  Family History  Problem Relation Age of Onset  . Hypertension Mother   .  Hypertension Father     Allergies  Allergen Reactions  . Zegerid [Omeprazole-Sodium Bicarbonate] Anaphylaxis    Rash, lip and hand swelling, itching , throat swelling    Current Outpatient Prescriptions on File Prior to Visit  Medication Sig Dispense Refill  . ondansetron (ZOFRAN) 4 MG tablet Take 1 tablet (4 mg total) by mouth every 8 (eight) hours as needed for nausea.  20 tablet  0  . promethazine (PHENERGAN) 12.5 MG tablet Take 1 tablet (12.5 mg total) by mouth every 8 (eight) hours as needed for nausea or vomiting.  20 tablet  0   No current facility-administered medications on file prior to visit.    BP 120/78  Temp(Src) 98.8 F (37.1 C) (Oral)  Wt 178 lb 1.6 oz (80.786 kg)chart     Objective:   Physical Exam  Constitutional: He is oriented to person, place, and time. He appears well-developed and well-nourished.  Neck: Normal range of motion. Neck supple.  Cardiovascular: Normal rate, regular rhythm and normal heart sounds.   Pulmonary/Chest: Effort normal and breath sounds normal.  Abdominal: Soft. Bowel sounds are normal.  Musculoskeletal: He exhibits tenderness.  Tenderness to palpation of the left lower back. Pain with rotation of the torso. Negative straight leg raise. No obvious swelling.  Neurological: He is alert and oriented to person, place, and  time. He has normal reflexes. No cranial nerve deficit. Coordination normal.  Skin: Skin is warm and dry.  Psychiatric: He has a normal mood and affect.          Assessment & Plan:  Sharief was seen today for no specified reason.  Diagnoses and associated orders for this visit:  Osteoarthritis of left knee, unspecified osteoarthritis type  Low back strain, initial encounter  Knee pain, unspecified laterality - celecoxib (CELEBREX) 200 MG capsule; Take 1 capsule (200 mg total) by mouth 2 (two) times daily.  Other Orders - cyclobenzaprine (FLEXERIL) 10 MG tablet; Take 1 tablet (10 mg total) by mouth 3 (three)  times daily as needed for muscle spasms.    Advised patient to consult with orthopedics about his left knee. Muscle strain to the lower back advised ice and heat. Call the office with any questions or concerns.

## 2014-07-25 NOTE — Progress Notes (Signed)
Pre visit review using our clinic review tool, if applicable. No additional management support is needed unless otherwise documented below in the visit note. 

## 2014-07-28 ENCOUNTER — Telehealth: Payer: Self-pay | Admitting: Internal Medicine

## 2014-07-28 NOTE — Telephone Encounter (Signed)
emmi emailed °

## 2014-08-15 ENCOUNTER — Other Ambulatory Visit: Payer: Federal, State, Local not specified - PPO

## 2014-08-18 ENCOUNTER — Telehealth: Payer: Self-pay | Admitting: Internal Medicine

## 2014-08-18 NOTE — Telephone Encounter (Signed)
Is this ok to provide note?

## 2014-08-18 NOTE — Telephone Encounter (Signed)
Pt has had to reschedule his appt because of work. Pt said they are mandating that he work on his day off.  Pt would like to keep his appt for 08/25/14 but he need a letter fax to him stating he has an appt on that day

## 2014-08-18 NOTE — Telephone Encounter (Signed)
Ok to fax letter stating he has appt with us on 11/2

## 2014-08-20 NOTE — Telephone Encounter (Signed)
Note faxed.

## 2014-08-20 NOTE — Telephone Encounter (Signed)
Pt states he never received the fax.  Fax  [phone] Pt needs letter stating he has blood work scheduled as well as appt.

## 2014-08-25 ENCOUNTER — Other Ambulatory Visit: Payer: Federal, State, Local not specified - PPO

## 2014-08-25 ENCOUNTER — Encounter: Payer: Federal, State, Local not specified - PPO | Admitting: Internal Medicine

## 2014-10-07 ENCOUNTER — Encounter: Payer: Self-pay | Admitting: Family Medicine

## 2014-10-07 ENCOUNTER — Ambulatory Visit (INDEPENDENT_AMBULATORY_CARE_PROVIDER_SITE_OTHER): Payer: Federal, State, Local not specified - PPO | Admitting: Family Medicine

## 2014-10-07 VITALS — BP 127/76 | HR 89 | Temp 98.0°F | Ht 68.0 in | Wt 180.0 lb

## 2014-10-07 DIAGNOSIS — M25511 Pain in right shoulder: Secondary | ICD-10-CM

## 2014-10-07 MED ORDER — PREDNISONE 10 MG PO TABS: ORAL_TABLET | ORAL | Status: DC

## 2014-10-07 NOTE — Progress Notes (Signed)
   Subjective:    Patient ID: Travis Rice., male    DOB: 11/30/1960, 53 y.o.   MRN: [id number]  HPI Here for 2 weeks of pain in the right lateral shoulder that started after he feel asleep on a couch. When he woke up it was stiff and painful. He has used heat and Advil with slight improvement. He also took a few Celebrex tablets and these were more helpful. No other trauma hx. No neck or arm pain.    Review of Systems  Constitutional: Negative.   Musculoskeletal: Positive for arthralgias.       Objective:   Physical Exam  Constitutional: He appears well-developed and well-nourished. No distress.  Musculoskeletal:  The right shoulder is not tender, ROM is full. However extremes of extension or internal/external rotation are painful. No crepitus.          Assessment & Plan:  Given a prednisone taper over the next 16 days. He will do ROM exercises four times daily. Recheck prn

## 2014-10-07 NOTE — Progress Notes (Signed)
Pre visit review using our clinic review tool, if applicable. No additional management support is needed unless otherwise documented below in the visit note. 

## 2014-10-22 ENCOUNTER — Encounter: Payer: Federal, State, Local not specified - PPO | Admitting: Internal Medicine

## 2014-11-04 ENCOUNTER — Ambulatory Visit (INDEPENDENT_AMBULATORY_CARE_PROVIDER_SITE_OTHER)
Admission: RE | Admit: 2014-11-04 | Discharge: 2014-11-04 | Disposition: A | Discharge status: Home / Self Care | Payer: Federal, State, Local not specified - PPO | Source: Ambulatory Visit | Attending: Family | Admitting: Family

## 2014-11-04 ENCOUNTER — Encounter: Payer: Self-pay | Admitting: Family

## 2014-11-04 ENCOUNTER — Ambulatory Visit (INDEPENDENT_AMBULATORY_CARE_PROVIDER_SITE_OTHER): Payer: Federal, State, Local not specified - PPO | Admitting: Family

## 2014-11-04 VITALS — BP 120/80 | HR 87 | Temp 98.3°F | Wt 181.0 lb

## 2014-11-04 DIAGNOSIS — M25511 Pain in right shoulder: Secondary | ICD-10-CM

## 2014-11-04 MED ORDER — CELECOXIB 200 MG PO CAPS: 200.0000 mg | ORAL_CAPSULE | Freq: Every day | ORAL | Status: DC

## 2014-11-04 NOTE — Progress Notes (Signed)
Subjective:    Patient ID: Travis FramesJohn L Flegel Jr., male    DOB: 1961-08-01, 54 y.o.   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 He was seen by Dr. Clent RidgesFry last month with the same pain and treated with prednisone that helped temporarily. Rates the pain a 6 out of 10 as worse with movement. Has been applying heating pads and taking warm showers that helps some. Describes the pain as achy that comes and goes. Worse with movement. 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 No acute injury   Review of Systems  Constitutional: Negative.   Respiratory: Negative.   Cardiovascular: Negative.   Endocrine: Negative.   Genitourinary: Negative.   Musculoskeletal: Positive for arthralgias.       Right shoulder pain worse with movement  Skin: Negative.   Allergic/Immunologic: Negative.   Neurological: Negative.   Psychiatric/Behavioral: Negative.   All other systems reviewed and are negative.  Past Medical History  Diagnosis Date  . Abnormal glucose   . Palpitations   . Abnormal partial thromboplastin time (PTT)   . Lupus anticoagulant positive     but negative for antiphosolipid antibody and anti B2 glycoprotein antibody    History   Social History  . Marital Status: Divorced    Spouse Name: N/A    Number of Children: N/A  . Years of Education: N/A   Occupational History  . Not on file.   Social History Main Topics  . Smoking status: Current Some Day Smoker -- 0.25 packs/day    Types: Cigarettes    Last Attempt to Quit: 05/24/2012  . Smokeless tobacco: Never Used     Comment: trying to quit  . Alcohol Use: 0.0 oz/week    0 Not specified per week     Comment: occ  . Drug Use: No  . Sexual Activity: Not on file   Other Topics Concern  . Not on file   Social History Narrative    No past surgical history on file.  Family History  Problem Relation Age of Onset  . Hypertension Mother     . Hypertension Father     Allergies  Allergen Reactions  . Zegerid [Omeprazole-Sodium Bicarbonate] Anaphylaxis    Rash, lip and hand swelling, itching , throat swelling    Current Outpatient Prescriptions on File Prior to Visit  Medication Sig Dispense Refill  . cyclobenzaprine (FLEXERIL) 10 MG tablet Take 1 tablet (10 mg total) by mouth 3 (three) times daily as needed for muscle spasms. (Patient not taking: Reported on 10/07/2014) 30 tablet 0  . ondansetron (ZOFRAN) 4 MG tablet Take 1 tablet (4 mg total) by mouth every 8 (eight) hours as needed for nausea. (Patient not taking: Reported on 10/07/2014) 20 tablet 0  . promethazine (PHENERGAN) 12.5 MG tablet Take 1 tablet (12.5 mg total) by mouth every 8 (eight) hours as needed for nausea or vomiting. (Patient not taking: Reported on 10/07/2014) 20 tablet 0   No current facility-administered medications on file prior to visit.    BP 120/80 mmHg  Pulse 87  Temp(Src) 98.3 F (36.8 C) (Oral)  Wt 181 lb (82.101 kg)chart    Objective:   Physical Exam  Constitutional: He appears well-developed and well-nourished.  Neck: Normal range of motion. Neck supple.  Cardiovascular: Normal rate, regular rhythm and normal heart sounds.   Pulmonary/Chest: Effort normal and breath sounds normal.  Abdominal: Bowel sounds are normal.  Musculoskeletal: He exhibits tenderness. He exhibits no edema.  Right shoulder: Tenderness to palpation laterally. Pain with forced flexion and abduction. Negative empty can test. Radial pulses 2 out of 2.  Neurological: He is alert.  Skin: Skin is warm.  Psychiatric: He has a normal mood and affect.          Assessment & Plan:  Dempsy was seen today for shoulder pain.  Diagnoses and associated orders for this visit:  Right shoulder pain - DG Shoulder Right; Future  Other Orders - celecoxib (CELEBREX) 200 MG capsule; Take 1 capsule (200 mg total) by mouth daily.    X-ray of the right shoulder when the  patient pending results. Consider MRI symptoms persist. Treat with Celebrex 200 mg once daily. Heating pad as needed. Call the office with any questions or concerns. Consider referral to orthopedics if necessary.

## 2014-11-04 NOTE — Progress Notes (Signed)
Pre visit review using our clinic review tool, if applicable. No additional management support is needed unless otherwise documented below in the visit note. 

## 2014-11-04 NOTE — Patient Instructions (Signed)
Rotator Cuff Injury Rotator cuff injury is any type of injury to the set of muscles and tendons that make up the stabilizing unit of your shoulder. This unit holds the ball of your upper arm bone (humerus) in the socket of your shoulder blade (scapula).  CAUSES Injuries to your rotator cuff most commonly come from sports or activities that cause your arm to be moved repeatedly over your head. Examples of this include throwing, weight lifting, swimming, or racquet sports. Long lasting (chronic) irritation of your rotator cuff can cause soreness and swelling (inflammation), bursitis, and eventual damage to your tendons, such as a tear (rupture). SIGNS AND SYMPTOMS Acute rotator cuff tear:  Sudden tearing sensation followed by severe pain shooting from your upper shoulder down your arm toward your elbow.  Decreased range of motion of your shoulder because of pain and muscle spasm.  Severe pain.  Inability to raise your arm out to the side because of pain and loss of muscle power (large tears). Chronic rotator cuff tear:  Pain that usually is worse at night and may interfere with sleep.  Gradual weakness and decreased shoulder motion as the pain worsens.  Decreased range of motion. Rotator cuff tendinitis:  Deep ache in your shoulder and the outside upper arm over your shoulder.  Pain that comes on gradually and becomes worse when lifting your arm to the side or turning it inward. DIAGNOSIS Rotator cuff injury is diagnosed through a medical history, physical exam, and imaging exam. The medical history helps determine the type of rotator cuff injury. Your health care provider will look at your injured shoulder, feel the injured area, and ask you to move your shoulder in different positions. X-ray exams typically are done to rule out other causes of shoulder pain, such as fractures. MRI is the exam of choice for the most severe shoulder injuries because the images show muscles and tendons.    TREATMENT  Chronic tear:  Medicine for pain, such as acetaminophen or ibuprofen.  Physical therapy and range-of-motion exercises may be helpful in maintaining shoulder function and strength.  Steroid injections into your shoulder joint.  Surgical repair of the rotator cuff if the injury does not heal with noninvasive treatment. Acute tear:  Anti-inflammatory medicines such as ibuprofen and naproxen to help reduce pain and swelling.  A sling to help support your arm and rest your rotator cuff muscles. Long-term use of a sling is not advised. It may cause significant stiffening of the shoulder joint.  Surgery may be considered within a few weeks, especially in younger, active people, to return the shoulder to full function.  Indications for surgical treatment include the following:  Age younger than 60 years.  Rotator cuff tears that are complete.  Physical therapy, rest, and anti-inflammatory medicines have been used for 6-8 weeks, with no improvement.  Employment or sporting activity that requires constant shoulder use. Tendinitis:  Anti-inflammatory medicines such as ibuprofen and naproxen to help reduce pain and swelling.  A sling to help support your arm and rest your rotator cuff muscles. Long-term use of a sling is not advised. It may cause significant stiffening of the shoulder joint.  Severe tendinitis may require:  Steroid injections into your shoulder joint.  Physical therapy.  Surgery. HOME CARE INSTRUCTIONS   Apply ice to your injury:  Put ice in a plastic bag.  Place a towel between your skin and the bag.  Leave the ice on for 20 minutes, 2-3 times a day.  If you   have a shoulder immobilizer (sling and straps), wear it until told otherwise by your health care provider.  You may want to sleep on several pillows or in a recliner at night to lessen swelling and pain.  Only take over-the-counter or prescription medicines for pain, discomfort, or fever as  directed by your health care provider.  Do simple hand squeezing exercises with a soft rubber ball to decrease hand swelling. SEEK MEDICAL CARE IF:   Your shoulder pain increases, or new pain or numbness develops in your arm, hand, or fingers.  Your hand or fingers are colder than your other hand. SEEK IMMEDIATE MEDICAL CARE IF:   Your arm, hand, or fingers are numb or tingling.  Your arm, hand, or fingers are increasingly swollen and painful, or they turn white or blue. MAKE SURE YOU:  Understand these instructions.  Will watch your condition.  Will get help right away if you are not doing well or get worse. Document Released: 10/07/2000 Document Revised: 10/15/2013 Document Reviewed: 05/22/2013 Exit[employer] Patient Information 2015 [employer]. This information is not intended to replace advice given to you by your health care provider. Make sure you discuss any questions you have with your health care provider.  

## 2014-12-01 ENCOUNTER — Encounter: Payer: Self-pay | Admitting: Internal Medicine

## 2014-12-01 ENCOUNTER — Ambulatory Visit (INDEPENDENT_AMBULATORY_CARE_PROVIDER_SITE_OTHER): Payer: Federal, State, Local not specified - PPO | Admitting: Internal Medicine

## 2014-12-01 ENCOUNTER — Other Ambulatory Visit (INDEPENDENT_AMBULATORY_CARE_PROVIDER_SITE_OTHER): Payer: Federal, State, Local not specified - PPO

## 2014-12-01 VITALS — BP 146/80 | HR 93 | Temp 98.7°F | Ht 67.0 in | Wt 185.0 lb

## 2014-12-01 DIAGNOSIS — Z0000 Encounter for general adult medical examination without abnormal findings: Secondary | ICD-10-CM

## 2014-12-01 LAB — POCT URINALYSIS DIPSTICK
BILIRUBIN UA: NEGATIVE
GLUCOSE UA: NEGATIVE
Ketones, UA: NEGATIVE
Leukocytes, UA: NEGATIVE
NITRITE UA: NEGATIVE
Protein, UA: NEGATIVE
Spec Grav, UA: 1.02
Urobilinogen, UA: 0.2
pH, UA: 5.5

## 2014-12-01 LAB — LIPID PANEL
CHOL/HDL RATIO: 3
Cholesterol: 241 mg/dL — ABNORMAL HIGH (ref 0–200)
HDL: 86.3 mg/dL (ref >39.00)
LDL CALC: 140 mg/dL — AB (ref 0–99)
NonHDL: 154.7
TRIGLYCERIDES: 72 mg/dL (ref 0.0–149.0)
VLDL: 14.4 mg/dL (ref 0.0–40.0)

## 2014-12-01 LAB — CBC WITH DIFFERENTIAL/PLATELET
BASOS ABS: 0 10*3/uL (ref 0.0–0.1)
Basophils Relative: 0.4 % (ref 0.0–3.0)
EOS PCT: 3 % (ref 0.0–5.0)
Eosinophils Absolute: 0.3 10*3/uL (ref 0.0–0.7)
HCT: 42.3 % (ref 39.0–52.0)
Hemoglobin: 14.4 g/dL (ref 13.0–17.0)
Lymphocytes Relative: 26.3 % (ref 12.0–46.0)
Lymphs Abs: 2.4 10*3/uL (ref 0.7–4.0)
MCHC: 34.1 g/dL (ref 30.0–36.0)
MCV: 92.5 fl (ref 78.0–100.0)
Monocytes Absolute: 1 10*3/uL (ref 0.1–1.0)
Monocytes Relative: 10.9 % (ref 3.0–12.0)
NEUTROS PCT: 59.4 % (ref 43.0–77.0)
Neutro Abs: 5.3 10*3/uL (ref 1.4–7.7)
Platelets: 402 10*3/uL — ABNORMAL HIGH (ref 150.0–400.0)
RBC: 4.57 Mil/uL (ref 4.22–5.81)
RDW: 13.5 % (ref 11.5–15.5)
WBC: 9 10*3/uL (ref 4.0–10.5)

## 2014-12-01 LAB — HEPATIC FUNCTION PANEL
ALBUMIN: 4.3 g/dL (ref 3.5–5.2)
ALT: 21 U/L (ref 0–53)
AST: 17 U/L (ref 0–37)
Alkaline Phosphatase: 64 U/L (ref 39–117)
BILIRUBIN TOTAL: 0.7 mg/dL (ref 0.2–1.2)
Bilirubin, Direct: 0.1 mg/dL (ref 0.0–0.3)
TOTAL PROTEIN: 6.9 g/dL (ref 6.0–8.3)

## 2014-12-01 LAB — TSH: TSH: 0.41 u[IU]/mL (ref 0.35–4.50)

## 2014-12-01 LAB — BASIC METABOLIC PANEL
BUN: 19 mg/dL (ref 6–23)
CALCIUM: 9.9 mg/dL (ref 8.4–10.5)
CHLORIDE: 108 meq/L (ref 96–112)
CO2: 24 mEq/L (ref 19–32)
CREATININE: 1.16 mg/dL (ref 0.40–1.50)
GFR: 83.76 mL/min (ref >60.00)
Glucose, Bld: 98 mg/dL (ref 70–99)
Potassium: 5 mEq/L (ref 3.5–5.1)
Sodium: 141 mEq/L (ref 135–145)

## 2014-12-01 LAB — PSA: PSA: 0.82 ng/mL (ref 0.10–4.00)

## 2014-12-01 NOTE — Progress Notes (Signed)
Pre visit review using our clinic review tool, if applicable. No additional management support is needed unless otherwise documented below in the visit note. 

## 2014-12-01 NOTE — Progress Notes (Signed)
Subjective:    Patient ID: Travis Frames., male    DOB: 09-06-1961, 54 y.o.   MRN: [id number]  HPI  54 year old [place] American male presents for routine physical. Patient denies any significant interval medical history. Pertinent social history and family history reviewed and updated. Patient's father passed away in late 02/25/2014. His father died of complications of lung cancer.   Screening blood work reviewed. His cholesterol panel is stable.  Patient quit smoking in 02-26-12. However he occasionally has a cigarette.  He reports cutting back on his intake of sugary beverages.  Review of Systems  Constitutional: mild weight gain, negative for anorexia Eyes: Negative for visual disturbance.  Respiratory: Negative for cough, chest tightness and shortness of breath.   Cardiovascular: Negative for chest pain.  Genitourinary: Negative for difficulty urinating.  Neurological: Negative for headaches.  Gastrointestinal: Negative for abdominal pain, heartburn melena or hematochezia Psych: Negative for depression or anxiety Endo:  No polyuria or polydypsia        Past Medical History  Diagnosis Date  . Abnormal glucose   . Palpitations   . Abnormal partial thromboplastin time (PTT)   . Lupus anticoagulant positive     but negative for antiphosolipid antibody and anti B2 glycoprotein antibody    History   Social History  . Marital Status: Divorced    Spouse Name: N/A    Number of Children: N/A  . Years of Education: N/A   Occupational History  . Not on file.   Social History Main Topics  . Smoking status: Current Some Day Smoker -- 0.25 packs/day    Types: Cigarettes    Last Attempt to Quit: 05/24/2012  . Smokeless tobacco: Never Used     Comment: trying to quit  . Alcohol Use: 0.0 oz/week    0 Not specified per week     Comment: occ  . Drug Use: No  . Sexual Activity: Not on file   Other Topics Concern  . Not on file   Social History Narrative    No past  surgical history on file.  Family History  Problem Relation Age of Onset  . Hypertension Mother   . Hypertension Father   . Lung cancer Father     Allergies  Allergen Reactions  . Zegerid [Omeprazole-Sodium Bicarbonate] Anaphylaxis    Rash, lip and hand swelling, itching , throat swelling    No current outpatient prescriptions on file prior to visit.   No current facility-administered medications on file prior to visit.    BP 146/80 mmHg  Pulse 93  Temp(Src) 98.7 F (37.1 C) (Oral)  Ht  (1.702 m)  Wt 185 lb (83.915 kg)  BMI 28.97 kg/m2    Objective:   Physical Exam  Constitutional: He is oriented to person, place, and time. He appears well-developed and well-nourished. No distress.  HENT:  Head: Normocephalic and atraumatic.  Right Ear: External ear normal.  Left Ear: External ear normal.  Mouth/Throat: Oropharynx is clear and moist.  Eyes: EOM are normal. Pupils are equal, round, and reactive to light.  Neck: Neck supple.  Cardiovascular: Normal rate, regular rhythm and normal heart sounds.   No murmur heard. Mild bilateral lower extremity varicose veins  Pulmonary/Chest: Effort normal and breath sounds normal. He has no wheezes.  Abdominal: Soft. Bowel sounds are normal. There is no tenderness.  Genitourinary: Rectum normal and prostate normal. Guaiac negative stool.  Lymphadenopathy:    He has no cervical adenopathy.  Neurological: He is alert  and oriented to person, place, and time. No cranial nerve deficit.  Skin: Skin is warm and dry.  Psychiatric: He has a normal mood and affect. His behavior is normal.          Assessment & Plan:

## 2014-12-01 NOTE — Patient Instructions (Signed)
Please monitor your blood pressure as directed.  Call our office with your readings in 2 weeks Goal weight loss - 20 lbs over next 6-12 months Follow low saturated fat diet - see attached handout Stop all tobacco use Start regular exercise program if able

## 2014-12-01 NOTE — Assessment & Plan Note (Signed)
Reviewed adult health maintenance protocols.  Patient advised to completely discontinue smoking. Weight loss and regular exercise encouraged. Handout on low-cholesterol diet provided. Continue to avoid sweets and decrease intake of carbohydrates.  Patient has had screening colonoscopy.  We discussed pros and cons of prostate cancer screening. He elects to continue screening. His digital rectal exam was unremarkable. Lab Results  Component Value Date   PSA 0.82 12/01/2014   PSA 0.37 01/30/2013   PSA 0.30 12/02/2011

## 2014-12-02 ENCOUNTER — Telehealth: Payer: Self-pay | Admitting: Internal Medicine

## 2014-12-02 NOTE — Telephone Encounter (Signed)
Patient Name: Travis Rice  DOB: 03-25-1961    Initial Comment Caller states had a physical at the [facility] yesterday. Rx was to be sent in yesterday for a leg stocking to help with leg circulation and wasnt   Nurse Assessment  Nurse: Annye Englisharmon, RN, Angelique Blonderenise Date/Time (Eastern Time): 12/02/2014 5:02:36 PM  Confirm and document reason for call. If symptomatic, describe symptoms. ---Caller states had a physical at the [facility] yesterday. Rx was to be sent in for a leg stocking to help with leg circulation, but pharm does not have the Rx as of yesterday. Wants to know what to do.  Has the patient traveled out of the country within the last 30 days? ---Not Applicable  Does the patient require triage? ---No  Please document clinical information provided and list any resource used. ---Advised to go to the pharmacy and speak to the pharmacist regarding insurance coverage on a TED hose. Advised TED's are sold OTC w/o Rx, but it would be best if he let the pharm help him find one to fit is leg appropriately and get inst for use. Advised to cb if further asst needed. Verb understanding.     Guidelines    Guideline Title Affirmed Question Affirmed Notes       Final Disposition User   Clinical Call [place], RN, TEPPCO PartnersDenise

## 2014-12-02 NOTE — Telephone Encounter (Signed)
emmi emailed °

## 2014-12-03 NOTE — Telephone Encounter (Signed)
Noted  

## 2015-01-08 ENCOUNTER — Ambulatory Visit (INDEPENDENT_AMBULATORY_CARE_PROVIDER_SITE_OTHER): Payer: Federal, State, Local not specified - PPO | Admitting: Family Medicine

## 2015-01-08 ENCOUNTER — Encounter: Payer: Self-pay | Admitting: Family Medicine

## 2015-01-08 ENCOUNTER — Encounter: Payer: Self-pay | Admitting: *Deleted

## 2015-01-08 VITALS — BP 120/82 | HR 81 | Temp 98.3°F | Ht 67.0 in | Wt 180.9 lb

## 2015-01-08 DIAGNOSIS — J069 Acute upper respiratory infection, unspecified: Secondary | ICD-10-CM

## 2015-01-08 DIAGNOSIS — A084 Viral intestinal infection, unspecified: Secondary | ICD-10-CM

## 2015-01-08 NOTE — Progress Notes (Signed)
Pre visit review using our clinic review tool, if applicable. No additional management support is needed unless otherwise documented below in the visit note. 

## 2015-01-08 NOTE — Patient Instructions (Signed)
Plenty of fluids with electrolytes (gatorade, soup broth, etc)  No dairy for 1 week  Imodium for the diarrhea  Follow up as needed if worsening or not improving as we dicussed

## 2015-01-08 NOTE — Progress Notes (Signed)
   HPI:  Walk in acute visit for:  1) Diarrhea and nausea: -started yesterday -symptoms: 3 episodes watery diarrhea, nausea, chills last night -denies: pain, fevers, hematochezia or melena, inabiity to tol po fluids, travel, abx -daughter with stomach bug 3 days ago  2) "cold" -for 2-3 weeks -now improving but still with some nasal congestion and cough -no tick bite, sinus pain or SOB  ROS: See pertinent positives and negatives per HPI.  Past Medical History  Diagnosis Date  . Abnormal glucose   . Palpitations   . Abnormal partial thromboplastin time (PTT)   . Lupus anticoagulant positive     but negative for antiphosolipid antibody and anti B2 glycoprotein antibody    No past surgical history on file.  Family History  Problem Relation Age of Onset  . Hypertension Mother   . Hypertension Father   . Lung cancer Father     History   Social History  . Marital Status: Divorced    Spouse Name: N/A  . Number of Children: N/A  . Years of Education: N/A   Social History Main Topics  . Smoking status: Current Some Day Smoker -- 0.25 packs/day    Types: Cigarettes    Last Attempt to Quit: 05/24/2012  . Smokeless tobacco: Never Used     Comment: trying to quit  . Alcohol Use: 0.0 oz/week    0 Standard drinks or equivalent per week     Comment: occ  . Drug Use: No  . Sexual Activity: Not on file   Other Topics Concern  . None   Social History Narrative    No current outpatient prescriptions on file.  EXAM:  Filed Vitals:   01/08/15 1031  BP: 120/82  Pulse: 81  Temp: 98.3 F (36.8 C)    Body mass index is 28.33 kg/(m^2).  GENERAL: vitals reviewed and listed above, alert, oriented, appears well hydrated and in no acute distress  HEENT: atraumatic, conjunttiva clear, no obvious abnormalities on inspection of external nose and ears, normal appearance of ear canals and TMs, clear nasal congestion, mild post oropharyngeal erythema with PND, no tonsillar  edema or exudate, no sinus TTP  NECK: no obvious masses on inspection  LUNGS: clear to auscultation bilaterally, no wheezes, rales or rhonchi, good air movement  CV: HRRR, no peripheral edema  ABD: BS+, soft, NTTP  MS: moves all extremities without noticeable abnormality  PSYCH: pleasant and cooperative, no obvious depression or anxiety  ASSESSMENT AND PLAN:  Discussed the following assessment and plan:  No diagnosis found.  -given HPI and exam findings today, a serious infection or illness is unlikely. We discussed potential etiologies, with viral illnss likely and advised supportive care and monitoring. We discussed treatment side effects, likely course, antibiotic misuse, transmission, and signs of developing a serious illness. -of course, we advised to return or notify a doctor immediately if symptoms worsen or persist or new concerns arise.    There are no Patient Instructions on file for this visit.   Kriste BasqueKIM, Dareion Kneece R.

## 2015-03-06 ENCOUNTER — Encounter (HOSPITAL_COMMUNITY): Payer: Self-pay | Admitting: Emergency Medicine

## 2015-03-06 ENCOUNTER — Emergency Department (HOSPITAL_COMMUNITY): Payer: Federal, State, Local not specified - PPO

## 2015-03-06 ENCOUNTER — Inpatient Hospital Stay (HOSPITAL_COMMUNITY)
Admission: EM | Admit: 2015-03-06 | Discharge: 2015-03-08 | DRG: 871 | Disposition: A | Discharge status: Home / Self Care | Payer: Federal, State, Local not specified - PPO | Attending: Internal Medicine | Admitting: Internal Medicine

## 2015-03-06 ENCOUNTER — Telehealth: Payer: Self-pay | Admitting: *Deleted

## 2015-03-06 ENCOUNTER — Encounter: Payer: Self-pay | Admitting: Internal Medicine

## 2015-03-06 DIAGNOSIS — R05 Cough: Secondary | ICD-10-CM

## 2015-03-06 DIAGNOSIS — A419 Sepsis, unspecified organism: Principal | ICD-10-CM | POA: Diagnosis present

## 2015-03-06 DIAGNOSIS — N289 Disorder of kidney and ureter, unspecified: Secondary | ICD-10-CM

## 2015-03-06 DIAGNOSIS — I2510 Atherosclerotic heart disease of native coronary artery without angina pectoris: Secondary | ICD-10-CM | POA: Diagnosis present

## 2015-03-06 DIAGNOSIS — J189 Pneumonia, unspecified organism: Secondary | ICD-10-CM | POA: Diagnosis present

## 2015-03-06 DIAGNOSIS — E871 Hypo-osmolality and hyponatremia: Secondary | ICD-10-CM | POA: Diagnosis present

## 2015-03-06 DIAGNOSIS — Z888 Allergy status to other drugs, medicaments and biological substances status: Secondary | ICD-10-CM

## 2015-03-06 DIAGNOSIS — Z8249 Family history of ischemic heart disease and other diseases of the circulatory system: Secondary | ICD-10-CM

## 2015-03-06 DIAGNOSIS — Z801 Family history of malignant neoplasm of trachea, bronchus and lung: Secondary | ICD-10-CM

## 2015-03-06 DIAGNOSIS — Z79899 Other long term (current) drug therapy: Secondary | ICD-10-CM

## 2015-03-06 DIAGNOSIS — E876 Hypokalemia: Secondary | ICD-10-CM | POA: Diagnosis present

## 2015-03-06 DIAGNOSIS — R509 Fever, unspecified: Secondary | ICD-10-CM | POA: Diagnosis not present

## 2015-03-06 DIAGNOSIS — E860 Dehydration: Secondary | ICD-10-CM | POA: Diagnosis present

## 2015-03-06 DIAGNOSIS — R059 Cough, unspecified: Secondary | ICD-10-CM

## 2015-03-06 DIAGNOSIS — R748 Abnormal levels of other serum enzymes: Secondary | ICD-10-CM | POA: Diagnosis present

## 2015-03-06 DIAGNOSIS — F17210 Nicotine dependence, cigarettes, uncomplicated: Secondary | ICD-10-CM | POA: Diagnosis present

## 2015-03-06 LAB — COMPREHENSIVE METABOLIC PANEL
ALBUMIN: 4.1 g/dL (ref 3.5–5.0)
ALK PHOS: 78 U/L (ref 38–126)
ALT: 113 U/L — ABNORMAL HIGH (ref 17–63)
AST: 113 U/L — ABNORMAL HIGH (ref 15–41)
Anion gap: 10 (ref 5–15)
BILIRUBIN TOTAL: 1.4 mg/dL — AB (ref 0.3–1.2)
BUN: 15 mg/dL (ref 6–20)
CHLORIDE: 102 mmol/L (ref 101–111)
CO2: 20 mmol/L — AB (ref 22–32)
Calcium: 9.1 mg/dL (ref 8.9–10.3)
Creatinine, Ser: 1.27 mg/dL — ABNORMAL HIGH (ref 0.61–1.24)
GLUCOSE: 144 mg/dL — AB (ref 65–99)
POTASSIUM: 3.8 mmol/L (ref 3.5–5.1)
SODIUM: 132 mmol/L — AB (ref 135–145)
Total Protein: 8.8 g/dL — ABNORMAL HIGH (ref 6.5–8.1)

## 2015-03-06 LAB — INFLUENZA PANEL BY PCR (TYPE A & B)
H1N1 flu by pcr: NOT DETECTED
Influenza A By PCR: NEGATIVE
Influenza B By PCR: NEGATIVE

## 2015-03-06 LAB — CBC WITH DIFFERENTIAL/PLATELET
Basophils Absolute: 0 10*3/uL (ref 0.0–0.1)
Basophils Relative: 0 % (ref 0–1)
EOS PCT: 0 % (ref 0–5)
Eosinophils Absolute: 0 10*3/uL (ref 0.0–0.7)
HCT: 40.4 % (ref 39.0–52.0)
Hemoglobin: 14.2 g/dL (ref 13.0–17.0)
LYMPHS ABS: 1.3 10*3/uL (ref 0.7–4.0)
LYMPHS PCT: 6 % — AB (ref 12–46)
MCH: 32.2 pg (ref 26.0–34.0)
MCHC: 35.1 g/dL (ref 30.0–36.0)
MCV: 91.6 fL (ref 78.0–100.0)
MONO ABS: 2.4 10*3/uL — AB (ref 0.1–1.0)
Monocytes Relative: 11 % (ref 3–12)
Neutro Abs: 18.3 10*3/uL — ABNORMAL HIGH (ref 1.7–7.7)
Neutrophils Relative %: 83 % — ABNORMAL HIGH (ref 43–77)
Platelets: 309 10*3/uL (ref 150–400)
RBC: 4.41 MIL/uL (ref 4.22–5.81)
RDW: 13.2 % (ref 11.5–15.5)
WBC: 22 10*3/uL — ABNORMAL HIGH (ref 4.0–10.5)

## 2015-03-06 LAB — URINALYSIS, ROUTINE W REFLEX MICROSCOPIC
Bilirubin Urine: NEGATIVE
Glucose, UA: NEGATIVE mg/dL
KETONES UR: NEGATIVE mg/dL
Leukocytes, UA: NEGATIVE
NITRITE: NEGATIVE
PH: 5.5 (ref 5.0–8.0)
Protein, ur: 100 mg/dL — AB
SPECIFIC GRAVITY, URINE: 1.011 (ref 1.005–1.030)
Urobilinogen, UA: 0.2 mg/dL (ref 0.0–1.0)

## 2015-03-06 LAB — URINE MICROSCOPIC-ADD ON

## 2015-03-06 LAB — I-STAT CG4 LACTIC ACID, ED
Lactic Acid, Venous: 1.73 mmol/L (ref 0.5–2.0)
Lactic Acid, Venous: 0.96 mmol/L (ref 0.5–2.0)

## 2015-03-06 MED ORDER — ACETAMINOPHEN 325 MG PO TABS
650.0000 mg | ORAL_TABLET | Freq: Four times a day (QID) | ORAL | Status: DC | PRN
Start: 2015-03-06 — End: 2015-03-08
  Administered 2015-03-06 – 2015-03-08 (×6): 650 mg via ORAL
  Filled 2015-03-06 (×6): qty 2

## 2015-03-06 MED ORDER — CETYLPYRIDINIUM CHLORIDE 0.05 % MT LIQD
7.0000 mL | Freq: Two times a day (BID) | OROMUCOSAL | Status: DC
  Administered 2015-03-06 – 2015-03-07 (×3): 7 mL via OROMUCOSAL

## 2015-03-06 MED ORDER — CHLORHEXIDINE GLUCONATE 0.12 % MT SOLN
15.0000 mL | Freq: Two times a day (BID) | OROMUCOSAL | Status: DC
  Administered 2015-03-06 – 2015-03-08 (×5): 15 mL via OROMUCOSAL
  Filled 2015-03-06 (×6): qty 15

## 2015-03-06 MED ORDER — DEXTROSE 5 % IV SOLN: 1.0000 g | INTRAVENOUS | Status: DC

## 2015-03-06 MED ORDER — SODIUM CHLORIDE 0.9 % IV SOLN
INTRAVENOUS | Status: AC
  Administered 2015-03-06: 15:00:00 via INTRAVENOUS

## 2015-03-06 MED ORDER — AZITHROMYCIN 500 MG IV SOLR
500.0000 mg | Freq: Once | INTRAVENOUS | Status: AC
  Administered 2015-03-06: 500 mg via INTRAVENOUS
  Filled 2015-03-06: qty 500

## 2015-03-06 MED ORDER — DEXTROSE 5 % IV SOLN
500.0000 mg | INTRAVENOUS | Status: DC
  Administered 2015-03-07: 500 mg via INTRAVENOUS
  Filled 2015-03-06 (×2): qty 500

## 2015-03-06 MED ORDER — SODIUM CHLORIDE 0.9 % IV BOLUS (SEPSIS)
1000.0000 mL | INTRAVENOUS | Status: AC
  Administered 2015-03-06: 1000 mL via INTRAVENOUS

## 2015-03-06 MED ORDER — DEXTROSE 5 % IV SOLN
1.0000 g | INTRAVENOUS | Status: DC
  Administered 2015-03-06 – 2015-03-07 (×2): 1 g via INTRAVENOUS
  Filled 2015-03-06 (×3): qty 10

## 2015-03-06 MED ORDER — SODIUM CHLORIDE 0.9 % IV BOLUS (SEPSIS)
500.0000 mL | INTRAVENOUS | Status: AC
  Administered 2015-03-06: 500 mL via INTRAVENOUS

## 2015-03-06 MED ORDER — ENOXAPARIN SODIUM 40 MG/0.4ML ~~LOC~~ SOLN
40.0000 mg | SUBCUTANEOUS | Status: DC
  Administered 2015-03-06 – 2015-03-07 (×2): 40 mg via SUBCUTANEOUS
  Filled 2015-03-06 (×3): qty 0.4

## 2015-03-06 NOTE — Progress Notes (Signed)
ANTIBIOTIC CONSULT NOTE - INITIAL  Pharmacy Consult for ceftriaxone/azithromycin Indication: CAP/sepsis  Allergies  Allergen Reactions  . Zegerid [Omeprazole-Sodium Bicarbonate] Anaphylaxis    Rash, lip and hand swelling, itching , throat swelling    Patient Measurements:   Adjusted Body Weight:   Vital Signs: Temp: 102.3 F (39.1 C) (05/13 1005) Temp Source: Oral (05/13 1005) BP: 166/90 mmHg (05/13 1005) Pulse Rate: 129 (05/13 1005) Intake/Output from previous day:   Intake/Output from this shift:    Labs:  Recent Labs  03/06/15 1042  WBC 22.0*  HGB 14.2  PLT 309  CREATININE 1.27*   CrCl cannot be calculated (Unknown ideal weight.). No results for input(s): VANCOTROUGH, VANCOPEAK, VANCORANDOM, GENTTROUGH, GENTPEAK, GENTRANDOM, TOBRATROUGH, TOBRAPEAK, TOBRARND, AMIKACINPEAK, AMIKACINTROU, AMIKACIN in the last 72 hours.   Microbiology: No results found for this or any previous visit (from the past 720 hour(s)).  Medical History: Past Medical History  Diagnosis Date  . Abnormal glucose   . Palpitations   . Abnormal partial thromboplastin time (PTT)   . Lupus anticoagulant positive     but negative for antiphosolipid antibody and anti B2 glycoprotein antibody    Assessment: 54 YOM presents with fever, chills, headache, diarrhea, and productive cough.  Code sepsis initiated, orders for ceftriaxone and azithromycin per pharmacy.  WBC noted to be elevated with mild elevation in SCr.    5/13 >> ceftriaxone  5/13 >> azithromycin  5/ influenza PCR 5/13 Blood:   Goal of Therapy:  Dose per patient-specific parameters  Plan:   Ceftriaxone 1gm IV q24h  Azithromycin 500mg  IV q24h  No further dose adjustment needed for either antibiotic.   Juliette Alcideustin Karlo Goeden, PharmD, BCPS.   Pager: [phone]  03/06/2015,11:42 AM

## 2015-03-06 NOTE — ED Notes (Signed)
Pt is aware of urine will info staff when can go

## 2015-03-06 NOTE — ED Notes (Signed)
Pt from home c/o body aches, fever, HA and diarrhea that started 1 day ago. Pt also has productive cough with white sputum. Pt denies CP or SOB. Pt lung sounds are clear bilaterally. Pt c/o generalized aches 6/10. Pt is A&O and in NAD

## 2015-03-06 NOTE — ED Provider Notes (Signed)
CSN: [id number]     Arrival date & time 03/06/15  [id number] History   First MD Initiated Contact with Patient 03/06/15 1008     Chief Complaint  Patient presents with  . Generalized Body Aches  . Fever  . Cough    HPI   54 year old male presents today with fever, chills, headache, diarrhea, body aches, and productive cough. Patient reports cough is productive with "white" sputum. Patient notes that he was feeling fine until yesterday, denies any sick close contacts. Patient denies chest pain, shortness of breath,  abdominal pain, lower extremity swelling or edema, or changes in  bladder characteristics or frequency.   Past Medical History  Diagnosis Date  . Abnormal glucose   . Palpitations   . Abnormal partial thromboplastin time (PTT)   . Lupus anticoagulant positive     but negative for antiphosolipid antibody and anti B2 glycoprotein antibody   History reviewed. No pertinent past surgical history. Family History  Problem Relation Age of Onset  . Hypertension Mother   . Hypertension Father   . Lung cancer Father    History  Substance Use Topics  . Smoking status: Current Some Day Smoker -- 0.25 packs/day    Types: Cigarettes    Last Attempt to Quit: 05/24/2012  . Smokeless tobacco: Never Used     Comment: trying to quit  . Alcohol Use: 0.0 oz/week    0 Standard drinks or equivalent per week     Comment: occ    Review of Systems  All other systems reviewed and are negative.   Allergies  Zegerid  Home Medications   Prior to Admission medications   Medication Sig Start Date End Date Taking? Authorizing Provider  Multiple Vitamins-Minerals (MULTIVITAMIN ADULT PO) Take 1 tablet by mouth daily.   Yes Historical Provider, MD   BP 166/90 mmHg  Pulse 129  Temp(Src) 102.3 F (39.1 C) (Oral)  Resp 24  SpO2 95% Physical Exam  Constitutional: He is oriented to person, place, and time. He appears well-developed and well-nourished.  HENT:  Head: Normocephalic and  atraumatic.  Eyes: Pupils are equal, round, and reactive to light.  Neck: Normal range of motion. Neck supple. No JVD present. No tracheal deviation present. No thyromegaly present.  Cardiovascular: Regular rhythm, normal heart sounds and intact distal pulses.  Exam reveals no gallop and no friction rub.   No murmur heard. Pulmonary/Chest: Effort normal. No stridor. No respiratory distress. He has no wheezes. He has rhonchi in the left lower field. He exhibits no tenderness.  Musculoskeletal: Normal range of motion.  Lymphadenopathy:    He has no cervical adenopathy.  Neurological: He is alert and oriented to person, place, and time. Coordination normal.  Skin: Skin is warm and dry.  Psychiatric: He has a normal mood and affect. His behavior is normal. Judgment and thought content normal.  Nursing note and vitals reviewed.   ED Course  Procedures (including critical care time) Labs Review Labs Reviewed  CBC WITH DIFFERENTIAL/PLATELET - Abnormal; Notable for the following:    WBC 22.0 (*)    Neutrophils Relative % 83 (*)    Lymphocytes Relative 6 (*)    Neutro Abs 18.3 (*)    Monocytes Absolute 2.4 (*)    All other components within normal limits  COMPREHENSIVE METABOLIC PANEL - Abnormal; Notable for the following:    Sodium 132 (*)    CO2 20 (*)    Glucose, Bld 144 (*)    Creatinine, Ser 1.27 (*)  Total Protein 8.8 (*)    AST 113 (*)    ALT 113 (*)    Total Bilirubin 1.4 (*)    All other components within normal limits  CULTURE, BLOOD (ROUTINE X 2)  CULTURE, BLOOD (ROUTINE X 2)  URINE CULTURE  URINALYSIS, ROUTINE W REFLEX MICROSCOPIC  INFLUENZA PANEL BY PCR (TYPE A & B, H1N1)  I-STAT CG4 LACTIC ACID, ED  I-STAT CG4 LACTIC ACID, ED    Imaging Review Dg Chest 2 View  03/06/2015   CLINICAL DATA:  New cough, body aches and fever over the last 2-3 days.  EXAM: CHEST  2 VIEW  COMPARISON:  None.  FINDINGS: Hazy consolidation is noted in the left lower lobe. Remainder of the  lungs is clear. No pleural effusion or pneumothorax.  Normal heart, mediastinum and hila.  Bony thorax is intact.  IMPRESSION: Left lower lobe pneumonia.   Electronically Signed   By: Amie Portlandavid  Ormond M.D.   On: 03/06/2015 10:57     EKG Interpretation None      MDM   Final diagnoses:  Cough  Community acquired pneumonia    Labs: Influenza, i-STAT lactic, blood culture, CBC, CMP- admitted him for leukocytosis 22.0  Imaging: Left lower lobe pneumonia  Consults: Tried hospitalist  Therapeutics: Ceftriaxone, Zithromax, acetaminophen  Assessment: Pneumonia, leukocytosis, fever  Plan: Patient presents with acute onset upper respiratory complaints with pneumonia on chest x-ray. Significant leukocytosis at 22 with a fever of 103 earlier in the day, 102.3 in ED, tachycardic at 129, tachypnea 24. Patient has not had recent hospital stay, this likely represents community-acquired pneumonia. Due to patient's abrupt onset of symptoms was significant findings hospitalist services consult for observation and antibiotic administration. They agreed for hospital observation.      Eyvonne MechanicJeffrey Raif Chachere, PA-C 03/08/15 1721  Samuel JesterKathleen McManus, DO 03/11/15 1109

## 2015-03-06 NOTE — ED Notes (Signed)
Pt made aware of need for urine specimen 

## 2015-03-06 NOTE — H&P (Signed)
PCP:   [name], DO   Chief Complaint:  Fever, chills  HPI: 54 yo male overall healthy with one to 2 days of chills, and high fever last night.  No rashes.  No urinary symptoms, no cough.  Mild sob.  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  No sick contacts.  No swelling in legs or pain in legs.  No n/v/d.  No chest pain or abdominal pain.  No nasal congestion/uri symptoms.  Feels awful.  Mentation has been normal.  Not feeling well enough to eat.  No recent antibiotics.  Pt found to have a temp of 103, chills, rigors and pna on cxr.  Referred for admission for pneumonia and possible early sepsis.  Review of Systems:  Positive and negative as per HPI otherwise all other systems are negative  Past Medical History: Past Medical History  Diagnosis Date  . Abnormal glucose   . Palpitations   . Abnormal partial thromboplastin time (PTT)   . Lupus anticoagulant positive     but negative for antiphosolipid antibody and anti B2 glycoprotein antibody   History reviewed. No pertinent past surgical history.  Medications: Prior to Admission medications   Medication Sig Start Date End Date Taking? Authorizing Provider  Multiple Vitamins-Minerals (MULTIVITAMIN ADULT PO) Take 1 tablet by mouth daily.   Yes Historical Provider, MD    Allergies:   Allergies  Allergen Reactions  . Zegerid [Omeprazole-Sodium Bicarbonate] Anaphylaxis    Rash, lip and hand swelling, itching , throat swelling    Social History:  reports that he has been smoking Cigarettes.  He has been smoking about 0.25 packs per day. He has never used smokeless tobacco. He reports that he drinks alcohol. He reports that he does not use illicit drugs.  Family History: Family History  Problem Relation Age of Onset  . Hypertension Mother   . Hypertension Father   . Lung cancer Father     Physical Exam: Filed Vitals:   03/06/15 1005 03/06/15 1228  BP: 166/90 140/82  Pulse: 129 93  Temp: 102.3 F (39.1 C) 99.9  F (37.7 C)  TempSrc: Oral Oral  Resp: 24 21  SpO2: 95% 98%   General appearance: alert, cooperative, no distress and toxic Head: Normocephalic, without obvious abnormality, atraumatic Eyes: negative Nose: Nares normal. Septum midline. Mucosa normal. No drainage or sinus tenderness. Neck: no JVD and supple, symmetrical, trachea midline Lungs: clear to auscultation bilaterally Heart: regular rate and rhythm, S1, S2 normal, no murmur, click, rub or gallop Abdomen: soft, non-tender; bowel sounds normal; no masses,  no organomegaly Extremities: extremities normal, atraumatic, no cyanosis or edema Pulses: 2+ and symmetric Skin: Skin color, texture, turgor normal. No rashes or lesions Neurologic: Grossly normal  Labs on Admission:   Recent Labs  03/06/15 1042  NA 132*  K 3.8  CL 102  CO2 20*  GLUCOSE 144*  BUN 15  CREATININE 1.27*  CALCIUM 9.1    Recent Labs  03/06/15 1042  AST 113*  ALT 113*  ALKPHOS 78  BILITOT 1.4*  PROT 8.8*  ALBUMIN 4.1    Recent Labs  03/06/15 1042  WBC 22.0*  NEUTROABS 18.3*  HGB 14.2  HCT 40.4  MCV 91.6  PLT 309   Radiological Exams on Admission: Dg Chest 2 View  03/06/2015   CLINICAL DATA:  New cough, body aches and fever over the last 2-3 days.  EXAM: CHEST  2 VIEW  COMPARISON:  None.  FINDINGS: Hazy consolidation is noted in  the left lower lobe. Remainder of the lungs is clear. No pleural effusion or pneumothorax.  Normal heart, mediastinum and hila.  Bony thorax is intact.  IMPRESSION: Left lower lobe pneumonia.   Electronically Signed   By: Amie Portlandavid  Ormond M.D.   On: 03/06/2015 10:57   cxr independently reviewed by myself  Assessment/Plan  54 yo male with CAP, LLL pna with possible early sepsis/SIRS  Principal Problem:   Pneumonia/early SIRS-  pna pathway.  Blood and sputum cultures have been ordered.  Lactic acid level less than 2.  Iv rocephin and azithromycin.  oxgyen sats are normal, pt initially tachycardic but this has  improved with ivf and APAP for fever.  obs on medical bed.  Likely improve with abx and ivf overnight.  Active Problems:   Coronary atherosclerosis-  stable   Hyponatremia-  From dehydration and volume depletion.  Replete with ns ivf overnight.   Dehydration-  As above   Renal insufficiency, mild-  Again secondary to above, and mild. Should improve with rehydration  Observe overnight.  Possible discharge tomorrow afternoon with oral antibiotics and close outpatient follow up.  FULL CODE.  Lavon Horn A 03/06/2015, 1:48 PM

## 2015-03-06 NOTE — Telephone Encounter (Signed)
Patient came to office as a walk-in. C/O Body aches, nausea, brown urine, emesis x1, and head throbbing. Stated symptoms started yesterday and nothing seem to provide relief. BP 138/90, HR 135, RR 26, Oral temp of 103.1, O2 at 94% on RA. Spoke to DO Kim and she advised patient to be seen by provider this morning or due to having 2 SIRS criteria, to go to the Urgent Care or Emergency Department. No providers available; [facility] Followed up and patient has checked into ED.

## 2015-03-07 DIAGNOSIS — I2510 Atherosclerotic heart disease of native coronary artery without angina pectoris: Secondary | ICD-10-CM | POA: Diagnosis present

## 2015-03-07 DIAGNOSIS — E876 Hypokalemia: Secondary | ICD-10-CM

## 2015-03-07 DIAGNOSIS — J189 Pneumonia, unspecified organism: Secondary | ICD-10-CM | POA: Diagnosis present

## 2015-03-07 DIAGNOSIS — Z720 Tobacco use: Secondary | ICD-10-CM

## 2015-03-07 DIAGNOSIS — N289 Disorder of kidney and ureter, unspecified: Secondary | ICD-10-CM | POA: Diagnosis present

## 2015-03-07 DIAGNOSIS — E860 Dehydration: Secondary | ICD-10-CM

## 2015-03-07 DIAGNOSIS — Z888 Allergy status to other drugs, medicaments and biological substances status: Secondary | ICD-10-CM | POA: Diagnosis not present

## 2015-03-07 DIAGNOSIS — Z79899 Other long term (current) drug therapy: Secondary | ICD-10-CM | POA: Diagnosis not present

## 2015-03-07 DIAGNOSIS — F17210 Nicotine dependence, cigarettes, uncomplicated: Secondary | ICD-10-CM | POA: Diagnosis present

## 2015-03-07 DIAGNOSIS — R748 Abnormal levels of other serum enzymes: Secondary | ICD-10-CM | POA: Diagnosis present

## 2015-03-07 DIAGNOSIS — R05 Cough: Secondary | ICD-10-CM | POA: Diagnosis not present

## 2015-03-07 DIAGNOSIS — E871 Hypo-osmolality and hyponatremia: Secondary | ICD-10-CM | POA: Diagnosis present

## 2015-03-07 DIAGNOSIS — R509 Fever, unspecified: Secondary | ICD-10-CM | POA: Diagnosis present

## 2015-03-07 DIAGNOSIS — Z801 Family history of malignant neoplasm of trachea, bronchus and lung: Secondary | ICD-10-CM | POA: Diagnosis not present

## 2015-03-07 DIAGNOSIS — Z8249 Family history of ischemic heart disease and other diseases of the circulatory system: Secondary | ICD-10-CM | POA: Diagnosis not present

## 2015-03-07 DIAGNOSIS — A419 Sepsis, unspecified organism: Secondary | ICD-10-CM | POA: Diagnosis present

## 2015-03-07 LAB — URINE CULTURE
CULTURE: NO GROWTH
Colony Count: NO GROWTH

## 2015-03-07 LAB — BASIC METABOLIC PANEL
ANION GAP: 12 (ref 5–15)
BUN: 10 mg/dL (ref 6–20)
CO2: 21 mmol/L — AB (ref 22–32)
CREATININE: 1.21 mg/dL (ref 0.61–1.24)
Calcium: 8.8 mg/dL — ABNORMAL LOW (ref 8.9–10.3)
Chloride: 105 mmol/L (ref 101–111)
GFR calc Af Amer: >60 mL/min (ref >60)
GFR calc non Af Amer: >60 mL/min (ref >60)
GLUCOSE: 129 mg/dL — AB (ref 65–99)
Potassium: 3.3 mmol/L — ABNORMAL LOW (ref 3.5–5.1)
Sodium: 138 mmol/L (ref 135–145)

## 2015-03-07 LAB — CBC WITH DIFFERENTIAL/PLATELET
BASOS ABS: 0 10*3/uL (ref 0.0–0.1)
Basophils Relative: 0 % (ref 0–1)
EOS ABS: 0 10*3/uL (ref 0.0–0.7)
EOS PCT: 0 % (ref 0–5)
HCT: 37.5 % — ABNORMAL LOW (ref 39.0–52.0)
Hemoglobin: 12.7 g/dL — ABNORMAL LOW (ref 13.0–17.0)
LYMPHS ABS: 1.3 10*3/uL (ref 0.7–4.0)
LYMPHS PCT: 8 % — AB (ref 12–46)
MCH: 31.3 pg (ref 26.0–34.0)
MCHC: 33.9 g/dL (ref 30.0–36.0)
MCV: 92.4 fL (ref 78.0–100.0)
MONO ABS: 1.8 10*3/uL — AB (ref 0.1–1.0)
Monocytes Relative: 11 % (ref 3–12)
Neutro Abs: 13.7 10*3/uL — ABNORMAL HIGH (ref 1.7–7.7)
Neutrophils Relative %: 81 % — ABNORMAL HIGH (ref 43–77)
PLATELETS: 294 10*3/uL (ref 150–400)
RBC: 4.06 MIL/uL — ABNORMAL LOW (ref 4.22–5.81)
RDW: 13.4 % (ref 11.5–15.5)
WBC: 16.8 10*3/uL — ABNORMAL HIGH (ref 4.0–10.5)

## 2015-03-07 LAB — EXPECTORATED SPUTUM ASSESSMENT W REFEX TO RESP CULTURE

## 2015-03-07 LAB — STREP PNEUMONIAE URINARY ANTIGEN: STREP PNEUMO URINARY ANTIGEN: NEGATIVE

## 2015-03-07 LAB — EXPECTORATED SPUTUM ASSESSMENT W GRAM STAIN, RFLX TO RESP C

## 2015-03-07 LAB — HIV ANTIBODY (ROUTINE TESTING W REFLEX): HIV Screen 4th Generation wRfx: NONREACTIVE

## 2015-03-07 MED ORDER — SODIUM CHLORIDE 0.9 % IV SOLN
INTRAVENOUS | Status: AC
Start: 2015-03-07 — End: 2015-03-07
  Administered 2015-03-07: 12:00:00 via INTRAVENOUS

## 2015-03-07 MED ORDER — POTASSIUM CHLORIDE CRYS ER 20 MEQ PO TBCR
40.0000 meq | EXTENDED_RELEASE_TABLET | ORAL | Status: AC
  Administered 2015-03-07 (×2): 40 meq via ORAL
  Filled 2015-03-07 (×3): qty 2

## 2015-03-07 MED ORDER — IBUPROFEN 200 MG PO TABS
600.0000 mg | ORAL_TABLET | Freq: Four times a day (QID) | ORAL | Status: DC | PRN
  Administered 2015-03-07 – 2015-03-08 (×3): 600 mg via ORAL
  Filled 2015-03-07 (×3): qty 3

## 2015-03-07 NOTE — Progress Notes (Signed)
Patient Demographics  Travis Rice, is a 54 y.o. male, DOB - 23-May-1961, EAV:[id number]  Admit date - 03/06/2015   Admitting Physician Haydee Monica, MD  Outpatient Primary MD for the patient is Thomos Lemons, DO  LOS -    Chief Complaint  Patient presents with  . Generalized Body Aches  . Fever  . Cough        Subjective:   Travis Rice today has, mild headache, No chest pain, No abdominal pain - No Nausea, No new weakness tingling or numbness, ImprovedCough - SOB.    Assessment & Plan    1. LLL CAP - improved with empiric antibiotics which will be continued, supportive care as needed with nebulizer treatments and oxygen, no oxygen need right now. Strep pneumonia antigen negative, follow pending HIV and legionella antigen. Likely discharge in 1-2 days.   2. History of smoking. Counseled to quit.   3. Hypokalemia. Replaced will recheck.   4. Dehydration. Hydrated with IV fluids monitor. Improved.  Code Status: Full  Family Communication: None  Disposition Plan: Home   Consults  None   Procedures None   DVT Prophylaxis  Lovenox   Lab Results  Component Value Date   PLT 294 03/07/2015    Medications  Scheduled Meds: . antiseptic oral rinse  7 mL Mouth Rinse q12n4p  . azithromycin  500 mg Intravenous Q24H  . cefTRIAXone (ROCEPHIN)  IV  1 g Intravenous Q24H  . chlorhexidine  15 mL Mouth Rinse BID  . enoxaparin (LOVENOX) injection  40 mg Subcutaneous Q24H  . potassium chloride  40 mEq Oral Q4H   Continuous Infusions: . sodium chloride 75 mL/hr at 03/07/15 0903   PRN Meds:.acetaminophen, ibuprofen  Antibiotics     Anti-infectives    Start     Dose/Rate Route Frequency Ordered Stop   03/07/15 1000  cefTRIAXone (ROCEPHIN) 1 g in dextrose 5 % 50 mL IVPB  Status:   Discontinued     1 g 100 mL/hr over 30 Minutes Intravenous Every 24 hours 03/06/15 1151 03/06/15 1152   03/07/15 1000  azithromycin (ZITHROMAX) 500 mg in dextrose 5 % 250 mL IVPB     500 mg 250 mL/hr over 60 Minutes Intravenous Every 24 hours 03/06/15 1151     03/06/15 1145  azithromycin (ZITHROMAX) 500 mg in dextrose 5 % 250 mL IVPB     500 mg 250 mL/hr over 60 Minutes Intravenous  Once 03/06/15 1132 03/06/15 1300   03/06/15 1130  cefTRIAXone (ROCEPHIN) 1 g in dextrose 5 % 50 mL IVPB     1 g 100 mL/hr over 30 Minutes Intravenous Every 24 hours 03/06/15 1121          Objective:   Filed Vitals:   03/06/15 1530 03/06/15 1653 03/06/15 2130 03/07/15 0532  BP:   179/85 167/87  Pulse:   118 87  Temp: 102.8 F (39.3 C) 100.3 F (37.9 C) 98.9 F (37.2 C) 100.4 F (38 C)  TempSrc: Oral Oral Oral Oral  Resp:   20 20  Weight:      SpO2:   94% 98%    Wt Readings from Last 3 Encounters:  03/06/15 79.243 kg (174 lb 11.2 oz)  01/08/15 82.056 kg (180 lb 14.4  oz)  12/01/14 83.915 kg (185 lb)     Intake/Output Summary (Last 24 hours) at 03/07/15 0954 Last data filed at 03/07/15 0533  Gross per 24 hour  Intake      0 ml  Output      1 ml  Net     -1 ml     Physical Exam  Awake Alert, Oriented X 3, No new F.N deficits, Normal affect [place]ler.AT,PERRAL Supple Neck,No JVD, No cervical lymphadenopathy appriciated.  Symmetrical Chest wall movement, Good air movement bilaterally, few LLL rales RRR,No Gallops,Rubs or new Murmurs, No Parasternal Heave +ve B.Sounds, Abd Soft, No tenderness, No organomegaly appriciated, No rebound - guarding or rigidity. No Cyanosis, Clubbing or edema, No new Rash or bruise     Data Review   Micro Results Recent Results (from the past 240 hour(s))  Culture, blood (routine x 2)     Status: None (Preliminary result)   Collection Time: 03/06/15 10:42 AM  Result Value Ref Range Status   Specimen Description BLOOD RIGHT FOREARM  Final   Special Requests  BOTTLES DRAWN AEROBIC AND ANAEROBIC 5CC  Final   Culture   Final           BLOOD CULTURE RECEIVED NO GROWTH TO DATE CULTURE WILL BE HELD FOR 5 DAYS BEFORE ISSUING A FINAL NEGATIVE REPORT [facility]    Report Status PENDING  Incomplete  Culture, blood (routine x 2)     Status: None (Preliminary result)   Collection Time: 03/06/15 10:43 AM  Result Value Ref Range Status   Specimen Description BLOOD LEFT WRIST  Final   Special Requests BOTTLES DRAWN AEROBIC AND ANAEROBIC 5CC  Final   Culture   Final           BLOOD CULTURE RECEIVED NO GROWTH TO DATE CULTURE WILL BE HELD FOR 5 DAYS BEFORE ISSUING A FINAL NEGATIVE REPORT Performed at [facility]    Report Status PENDING  Incomplete  Culture, sputum-assessment     Status: None   Collection Time: 03/07/15 12:12 AM  Result Value Ref Range Status   Specimen Description SPUTUM  Final   Special Requests NONE  Final   Sputum evaluation   Final    THIS SPECIMEN IS ACCEPTABLE. RESPIRATORY CULTURE REPORT TO FOLLOW.   Report Status 03/07/2015 FINAL  Final  Culture, respiratory (NON-Expectorated)     Status: None (Preliminary result)   Collection Time: 03/07/15 12:12 AM  Result Value Ref Range Status   Specimen Description SPUTUM  Final   Special Requests NONE  Final   Gram Stain   Final    MODERATE WBC PRESENT,BOTH PMN AND MONONUCLEAR RARE SQUAMOUS EPITHELIAL CELLS PRESENT NO ORGANISMS SEEN Performed at [facility]    Culture PENDING  Incomplete   Report Status PENDING  Incomplete    Radiology Reports Dg Chest 2 View  03/06/2015   CLINICAL DATA:  New cough, body aches and fever over the last 2-3 days.  EXAM: CHEST  2 VIEW  COMPARISON:  None.  FINDINGS: Hazy consolidation is noted in the left lower lobe. Remainder of the lungs is clear. No pleural effusion or pneumothorax.  Normal heart, mediastinum and hila.  Bony thorax is intact.  IMPRESSION: Left lower lobe pneumonia.   Electronically Signed   By:  Amie Portlandavid  Ormond M.D.   On: 03/06/2015 10:57     CBC  Recent Labs Lab 03/06/15 1042 03/07/15 0601  WBC 22.0* 16.8*  HGB 14.2 12.7*  HCT  40.4 37.5*  PLT 309 294  MCV 91.6 92.4  MCH 32.2 31.3  MCHC 35.1 33.9  RDW 13.2 13.4  LYMPHSABS 1.3 1.3  MONOABS 2.4* 1.8*  EOSABS 0.0 0.0  BASOSABS 0.0 0.0    Chemistries   Recent Labs Lab 03/06/15 1042 03/07/15 0601  NA 132* 138  K 3.8 3.3*  CL 102 105  CO2 20* 21*  GLUCOSE 144* 129*  BUN 15 10  CREATININE 1.27* 1.21  CALCIUM 9.1 8.8*  AST 113*  --   ALT 113*  --   ALKPHOS 78  --   BILITOT 1.4*  --    ------------------------------------------------------------------------------------------------------------------ estimated creatinine clearance is 63.7 mL/min (by C-G formula based on Cr of 1.21). ------------------------------------------------------------------------------------------------------------------ No results for input(s): HGBA1C in the last 72 hours. ------------------------------------------------------------------------------------------------------------------ No results for input(s): CHOL, HDL, LDLCALC, TRIG, CHOLHDL, LDLDIRECT in the last 72 hours. ------------------------------------------------------------------------------------------------------------------ No results for input(s): TSH, T4TOTAL, T3FREE, THYROIDAB in the last 72 hours.  Invalid input(s): FREET3 ------------------------------------------------------------------------------------------------------------------ No results for input(s): VITAMINB12, FOLATE, FERRITIN, TIBC, IRON, RETICCTPCT in the last 72 hours.  Coagulation profile No results for input(s): INR, PROTIME in the last 168 hours.  No results for input(s): DDIMER in the last 72 hours.  Cardiac Enzymes No results for input(s): CKMB, TROPONINI, MYOGLOBIN in the last 168 hours.  Invalid input(s):  CK ------------------------------------------------------------------------------------------------------------------ Invalid input(s): POCBNP   Time Spent in minutes   35   Susa RaringSINGH,PRASHANT K M.D on 03/07/2015 at 9:54 AM  Between 7am to 7pm - Pager - [phone]  After 7pm go to [email or web address] - password [email or web address]  [facility]   Office  [phone]

## 2015-03-08 DIAGNOSIS — R05 Cough: Secondary | ICD-10-CM

## 2015-03-08 DIAGNOSIS — E871 Hypo-osmolality and hyponatremia: Secondary | ICD-10-CM

## 2015-03-08 DIAGNOSIS — N289 Disorder of kidney and ureter, unspecified: Secondary | ICD-10-CM

## 2015-03-08 DIAGNOSIS — J189 Pneumonia, unspecified organism: Secondary | ICD-10-CM | POA: Insufficient documentation

## 2015-03-08 DIAGNOSIS — R059 Cough, unspecified: Secondary | ICD-10-CM | POA: Insufficient documentation

## 2015-03-08 LAB — CBC
HEMATOCRIT: 35.4 % — AB (ref 39.0–52.0)
HEMOGLOBIN: 11.9 g/dL — AB (ref 13.0–17.0)
MCH: 30.7 pg (ref 26.0–34.0)
MCHC: 33.6 g/dL (ref 30.0–36.0)
MCV: 91.2 fL (ref 78.0–100.0)
Platelets: 298 10*3/uL (ref 150–400)
RBC: 3.88 MIL/uL — ABNORMAL LOW (ref 4.22–5.81)
RDW: 13.4 % (ref 11.5–15.5)
WBC: 11 10*3/uL — ABNORMAL HIGH (ref 4.0–10.5)

## 2015-03-08 LAB — MAGNESIUM: Magnesium: 2.4 mg/dL (ref 1.7–2.4)

## 2015-03-08 LAB — BASIC METABOLIC PANEL
ANION GAP: 12 (ref 5–15)
BUN: 10 mg/dL (ref 6–20)
CO2: 20 mmol/L — AB (ref 22–32)
Calcium: 8.8 mg/dL — ABNORMAL LOW (ref 8.9–10.3)
Chloride: 107 mmol/L (ref 101–111)
Creatinine, Ser: 1.02 mg/dL (ref 0.61–1.24)
GLUCOSE: 105 mg/dL — AB (ref 65–99)
Potassium: 3.7 mmol/L (ref 3.5–5.1)
Sodium: 139 mmol/L (ref 135–145)

## 2015-03-08 MED ORDER — LEVOFLOXACIN 750 MG PO TABS: 750.0000 mg | ORAL_TABLET | Freq: Every day | ORAL | Status: DC

## 2015-03-08 NOTE — Plan of Care (Signed)
     Travis Rice was admitted to the Hospital on 03/06/2015 and Discharged  03/08/2015 and should be excused from work/school   for **  days starting 03/06/2015 , may return to work/school without any restrictions.  Call Susa RaringPrashant Eiliana Drone MD, [facility]  [phone] with questions.  Leroy SeaSINGH,Katlyne Nishida K M.D on 03/08/2015,at 2:01 PM  [facility]   Office  [phone]

## 2015-03-08 NOTE — Discharge Instructions (Signed)
Follow with Primary MD [name], DO in 4-5 days , follow final culture results  Get CBC, CMP, 2 view Chest X ray checked  by Primary MD next visit.    Activity: As tolerated with Full fall precautions use walker/cane & assistance as needed   Disposition Home    Diet: Heart Healthy   For Heart failure patients - Check your Weight same time everyday, if you gain over 2 pounds, or you develop in leg swelling, experience more shortness of breath or chest pain, call your Primary MD immediately. Follow Cardiac Low Salt Diet and 1.5 lit/day fluid restriction.   On your next visit with your primary care physician please Get Medicines reviewed and adjusted.   Please request your Prim.MD to go over all Hospital Tests and Procedure/Radiological results at the follow up, please get all Hospital records sent to your Prim MD by signing hospital release before you go home.   If you experience worsening of your admission symptoms, develop shortness of breath, life threatening emergency, suicidal or homicidal thoughts you must seek medical attention immediately by calling 911 or calling your MD immediately  if symptoms less severe.  You Must read complete instructions/literature along with all the possible adverse reactions/side effects for all the Medicines you take and that have been prescribed to you. Take any new Medicines after you have completely understood and accpet all the possible adverse reactions/side effects.   Do not drive, operating heavy machinery, perform activities at heights, swimming or participation in water activities or provide baby sitting services if your were admitted for syncope or siezures until you have seen by Primary MD or a Neurologist and advised to do so again.  Do not drive when taking Pain medications.    Do not take more than prescribed Pain, Sleep and Anxiety Medications  Special Instructions: If you have smoked or chewed Tobacco  in the last 2 yrs please stop  smoking, stop any regular Alcohol  and or any Recreational drug use.  Wear Seat belts while driving.   Please note  You were cared for by a hospitalist during your hospital stay. If you have any questions about your discharge medications or the care you received while you were in the hospital after you are discharged, you can call the unit and asked to speak with the hospitalist on call if the hospitalist that took care of you is not available. Once you are discharged, your primary care physician will handle any further medical issues. Please note that NO REFILLS for any discharge medications will be authorized once you are discharged, as it is imperative that you return to your primary care physician (or establish a relationship with a primary care physician if you do not have one) for your aftercare needs so that they can reassess your need for medications and monitor your lab values.                                                      Travis Rice was admitted to the Hospital on 03/06/2015 and Discharged  03/08/2015 and should be excused from work/school   For 5 days starting 03/06/2015 , may return to work/school without any restrictions.  Call Travis RaringPrashant [name] MD, [facility]  [phone] with questions.  Travis Rice,Travis Rice M.D on 03/08/2015,at 2:01 PM  [facility]  Office  [phone]

## 2015-03-08 NOTE — Discharge Summary (Signed)
Travis FramesJohn L Kussman Jr., is a 54 y.o. male  DOB [birth year]  MRN [id number].  Admission date:  03/06/2015  Admitting Physician  [name], MD  Discharge Date:  03/08/2015   Primary MD  [name], DO  Recommendations for primary care physician for things to follow:   Repeat CXR 2 view, look at final culture results, repeat CBC and CMP in 4-5 days   Admission Diagnosis  Cough [R05] Community acquired pneumonia [J18.9]   Discharge Diagnosis  Cough [R05] Community acquired pneumonia [J18.9]     Principal Problem:   Pneumonia Active Problems:   Coronary atherosclerosis   Hyponatremia   Dehydration   Renal insufficiency, mild   Community acquired pneumonia   Cough      Past Medical History  Diagnosis Date  . Abnormal glucose   . Palpitations   . Abnormal partial thromboplastin time (PTT)   . Lupus anticoagulant positive     but negative for antiphosolipid antibody and anti B2 glycoprotein antibody    History reviewed. No pertinent past surgical history.     History of present illness and  Hospital Course:     Kindly see H&P for history of present illness and admission details, please review complete Labs, Consult reports and Test reports for all details in brief  HPI  from the history and physical done on the day of admission  54 yo male overall healthy with one to 2 days of chills, and high fever last night. No rashes. No urinary symptoms, no cough. Mild sob. 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 No sick contacts. No swelling in legs or pain in legs. No n/v/d. No chest pain or abdominal pain. No nasal congestion/uri symptoms. Feels awful. Mentation has been normal. Not feeling well enough to eat. No recent antibiotics. Pt found to have a temp of 103, chills, rigors and pna on  cxr. Referred for admission for pneumonia and possible early sepsis.    Hospital Course    1. LLL CAP - improved with empiric IV antibiotics & supportive care , he is now completely symptom-free with no oxygen need right now. HIV, Strep pneumonia antigen negative,  pending final blood culture and legionella antigen. We'll discharge on 5 more days of oral Levaquin with close follow-up with PCP in 4-5 days. Request PCP to follow-up on the pending final blood culture results and legionella antigen results, repeat CBC CMP in 2 view chest x-ray next visit.   2. History of smoking. Counseled to quit.   3. Hypokalemia. Replaced and stable.   4. Dehydration. Hydrated with IV fluids and now resolved.   5. Mild nonspecific elevation of liver enzymes, no right upper quadrant pain, would be due to pneumonia and antibiotic effect. Repeat CMP next visit by PCP.  6. Mildly elevated lactic acid level due to dehydration and hypoperfusion. Has been hydrated, nontoxic appearing, completely symptom free and afebrile. Leukocytosis is improved. Will be discharged. As above repeat CMP in a week.     Discharge Condition:Stable  Follow UP  Follow-up Information    Follow up with Thomos Lemons, DO. Schedule an appointment as soon as possible for a visit in 4 days.   Specialty:  Internal Medicine   Contact information:   [address] [place] [address] [phone]         Discharge Instructions  and  Discharge Medications     Discharge Instructions    Diet - low sodium heart healthy    Complete by:  As directed      Discharge instructions    Complete by:  As directed   Follow with Primary MD Thomos Lemons, DO in 4-5 days , follow final culture results  Get CBC, CMP, 2 view Chest X ray checked  by Primary MD next visit.    Activity: As tolerated with Full fall precautions use walker/cane & assistance as needed   Disposition Home    Diet: Heart Healthy   For Heart failure  patients - Check your Weight same time everyday, if you gain over 2 pounds, or you develop in leg swelling, experience more shortness of breath or chest pain, call your Primary MD immediately. Follow Cardiac Low Salt Diet and 1.5 lit/day fluid restriction.   On your next visit with your primary care physician please Get Medicines reviewed and adjusted.   Please request your Prim.MD to go over all Hospital Tests and Procedure/Radiological results at the follow up, please get all Hospital records sent to your Prim MD by signing hospital release before you go home.   If you experience worsening of your admission symptoms, develop shortness of breath, life threatening emergency, suicidal or homicidal thoughts you must seek medical attention immediately by calling 911 or calling your MD immediately  if symptoms less severe.  You Must read complete instructions/literature along with all the possible adverse reactions/side effects for all the Medicines you take and that have been prescribed to you. Take any new Medicines after you have completely understood and accpet all the possible adverse reactions/side effects.   Do not drive, operating heavy machinery, perform activities at heights, swimming or participation in water activities or provide baby sitting services if your were admitted for syncope or siezures until you have seen by Primary MD or a Neurologist and advised to do so again.  Do not drive when taking Pain medications.    Do not take more than prescribed Pain, Sleep and Anxiety Medications  Special Instructions: If you have smoked or chewed Tobacco  in the last 2 yrs please stop smoking, stop any regular Alcohol  and or any Recreational drug use.  Wear Seat belts while driving.   Please note  You were cared for by a hospitalist during your hospital stay. If you have any questions about your discharge medications or the care you received while you were in the hospital after you are  discharged, you can call the unit and asked to speak with the hospitalist on call if the hospitalist that took care of you is not available. Once you are discharged, your primary care physician will handle any further medical issues. Please note that NO REFILLS for any discharge medications will be authorized once you are discharged, as it is imperative that you return to your primary care physician (or establish a relationship with a primary care physician if you do not have one) for your aftercare needs so that they can reassess your need for medications and monitor your lab values.  Annetta MawJohn Landing was admitted to the Hospital on 03/06/2015 and Discharged  03/07/2015 and should be excused from work/school   for 5  days starting 03/06/2015 , may return to work/school without any restrictions.  Call Susa RaringPrashant [name] MD, [facility]  [phone] with questions.  Leroy SeaSINGH,PRASHANT K M.D on 03/07/2015,at 8:34 AM  [facility]   Office  [phone]     Increase activity slowly    Complete by:  As directed             Medication List    TAKE these medications        levofloxacin 750 MG tablet  Commonly known as:  LEVAQUIN  Take 1 tablet (750 mg total) by mouth daily.     MULTIVITAMIN ADULT PO  Take 1 tablet by mouth daily.          Diet and Activity recommendation: See Discharge Instructions above   Consults obtained - None   Major procedures and Radiology Reports - PLEASE review detailed and final reports for all details, in brief -       Dg Chest 2 View  03/06/2015   CLINICAL DATA:  New cough, body aches and fever over the last 2-3 days.  EXAM: CHEST  2 VIEW  COMPARISON:  None.  FINDINGS: Hazy consolidation is noted in the left lower lobe. Remainder of the lungs is clear. No pleural effusion or pneumothorax.  Normal heart, mediastinum and hila.  Bony thorax is intact.  IMPRESSION: Left lower lobe pneumonia.    Electronically Signed   By: Amie Portlandavid  Ormond M.D.   On: 03/06/2015 10:57    Micro Results      Recent Results (from the past 240 hour(s))  Culture, blood (routine x 2)     Status: None (Preliminary result)   Collection Time: 03/06/15 10:42 AM  Result Value Ref Range Status   Specimen Description BLOOD RIGHT FOREARM  Final   Special Requests BOTTLES DRAWN AEROBIC AND ANAEROBIC 5CC  Final   Culture   Final           BLOOD CULTURE RECEIVED NO GROWTH TO DATE CULTURE WILL BE HELD FOR 5 DAYS BEFORE ISSUING A FINAL NEGATIVE REPORT [facility]formed at [facility]    Report Status PENDING  Incomplete  Culture, blood (routine x 2)     Status: None (Preliminary result)   Collection Time: 03/06/15 10:43 AM  Result Value Ref Range Status   Specimen Description BLOOD LEFT WRIST  Final   Special Requests BOTTLES DRAWN AEROBIC AND ANAEROBIC 5CC  Final   Culture   Final           BLOOD CULTURE RECEIVED NO GROWTH TO DATE CULTURE WILL BE HELD FOR 5 DAYS BEFORE ISSUING A FINAL NEGATIVE REPORT Performed at [facility]    Report Status PENDING  Incomplete  Urine culture     Status: None   Collection Time: 03/06/15  1:35 PM  Result Value Ref Range Status   Specimen Description URINE, CLEAN CATCH  Final   Special Requests NONE  Final   Colony Count NO GROWTH Performed at [facility]   Final   Culture NO GROWTH Performed at [facility]   Final   Report Status 03/07/2015 FINAL  Final  Culture, sputum-assessment     Status: None   Collection Time: 03/07/15 12:12 AM  Result Value Ref Range Status   Specimen Description SPUTUM  Final   Special Requests NONE  Final   Sputum evaluation  Final    THIS SPECIMEN IS ACCEPTABLE. RESPIRATORY CULTURE REPORT TO FOLLOW.   Report Status 03/07/2015 FINAL  Final  Culture, respiratory (NON-Expectorated)     Status: None (Preliminary result)   Collection Time: 03/07/15 12:12 AM  Result Value Ref Range Status   Specimen Description  SPUTUM  Final   Special Requests NONE  Final   Gram Stain   Final    MODERATE WBC PRESENT,BOTH PMN AND MONONUCLEAR RARE SQUAMOUS EPITHELIAL CELLS PRESENT NO ORGANISMS SEEN Performed at [facility]    Culture PENDING  Incomplete   Report Status PENDING  Incomplete       Today   Subjective:    Melford Tullier today has no headache,no chest abdominal pain,no new weakness tingling or numbness, feels much better wants to go home today.     Objective:   Blood pressure 135/84, pulse 82, temperature 98.5 F (36.9 C), temperature source Oral, resp. rate 18, weight 79.243 kg (174 lb 11.2 oz), SpO2 97 %.   Intake/Output Summary (Last 24 hours) at 03/08/15 0839 Last data filed at 03/07/15 1300  Gross per 24 hour  Intake    120 ml  Output      1 ml  Net    119 ml    Exam  Awake Alert, Oriented x 3, No new F.N deficits, Normal affect [place].AT,PERRAL Supple Neck,No JVD, No cervical lymphadenopathy appriciated.  Symmetrical Chest wall movement, Good air movement bilaterally, CTAB RRR,No Gallops,Rubs or new Murmurs, No Parasternal Heave +ve B.Sounds, Abd Soft, Non tender, No organomegaly appriciated, No rebound -guarding or rigidity. No Cyanosis, Clubbing or edema, No new Rash or bruise   Data Review   CBC w Diff: Lab Results  Component Value Date   WBC 11.0* 03/08/2015   HGB 11.9* 03/08/2015   HCT 35.4* 03/08/2015   PLT 298 03/08/2015   LYMPHOPCT 8* 03/07/2015   MONOPCT 11 03/07/2015   EOSPCT 0 03/07/2015   BASOPCT 0 03/07/2015    CMP: Lab Results  Component Value Date   NA 139 03/08/2015   K 3.7 03/08/2015   CL 107 03/08/2015   CO2 20* 03/08/2015   BUN 10 03/08/2015   CREATININE 1.02 03/08/2015   PROT 8.8* 03/06/2015   ALBUMIN 4.1 03/06/2015   BILITOT 1.4* 03/06/2015   ALKPHOS 78 03/06/2015   AST 113* 03/06/2015   ALT 113* 03/06/2015  .   Total Time in preparing paper work, data evaluation and todays exam - 35 minutes  [name] M.D on  03/08/2015 at 8:39 AM  [facility]   Office  [phone]

## 2015-03-09 LAB — CULTURE, RESPIRATORY

## 2015-03-09 LAB — CULTURE, RESPIRATORY W GRAM STAIN

## 2015-03-10 LAB — LEGIONELLA ANTIGEN, URINE

## 2015-03-12 LAB — CULTURE, BLOOD (ROUTINE X 2)
Culture: NO GROWTH
Culture: NO GROWTH

## 2015-04-08 ENCOUNTER — Ambulatory Visit (INDEPENDENT_AMBULATORY_CARE_PROVIDER_SITE_OTHER): Payer: Federal, State, Local not specified - PPO | Admitting: Adult Health

## 2015-04-08 ENCOUNTER — Telehealth: Payer: Self-pay | Admitting: Adult Health

## 2015-04-08 ENCOUNTER — Encounter: Payer: Self-pay | Admitting: Adult Health

## 2015-04-08 ENCOUNTER — Ambulatory Visit (INDEPENDENT_AMBULATORY_CARE_PROVIDER_SITE_OTHER)
Admission: RE | Admit: 2015-04-08 | Discharge: 2015-04-08 | Disposition: A | Discharge status: Home / Self Care | Payer: Federal, State, Local not specified - PPO | Source: Ambulatory Visit | Attending: Adult Health | Admitting: Adult Health

## 2015-04-08 ENCOUNTER — Ambulatory Visit: Payer: Federal, State, Local not specified - PPO | Admitting: Internal Medicine

## 2015-04-08 VITALS — BP 118/78 | HR 88 | Temp 98.5°F | Ht 67.0 in | Wt 179.5 lb

## 2015-04-08 DIAGNOSIS — Z09 Encounter for follow-up examination after completed treatment for conditions other than malignant neoplasm: Secondary | ICD-10-CM | POA: Diagnosis not present

## 2015-04-08 DIAGNOSIS — J189 Pneumonia, unspecified organism: Secondary | ICD-10-CM

## 2015-04-08 NOTE — Patient Instructions (Addendum)
Please go get a chest x ray and I will let you know what the results show. I am glad you are feeling better. Please let me know if there is anything I can do for you.  Pneumonia Pneumonia is an infection of the lungs.  CAUSES Pneumonia may be caused by bacteria or a virus. Usually, these infections are caused by breathing infectious particles into the lungs (respiratory tract). SIGNS AND SYMPTOMS   Cough.  Fever.  Chest pain.  Increased rate of breathing.  Wheezing.  Mucus production. DIAGNOSIS  If you have the common symptoms of pneumonia, your health care provider will typically confirm the diagnosis with a chest X-ray. The X-ray will show an abnormality in the lung (pulmonary infiltrate) if you have pneumonia. Other tests of your blood, urine, or sputum may be done to find the specific cause of your pneumonia. Your health care provider may also do tests (blood gases or pulse oximetry) to see how well your lungs are working. TREATMENT  Some forms of pneumonia may be spread to other people when you cough or sneeze. You may be asked to wear a mask before and during your exam. Pneumonia that is caused by bacteria is treated with antibiotic medicine. Pneumonia that is caused by the influenza virus may be treated with an antiviral medicine. Most other viral infections must run their course. These infections will not respond to antibiotics.  HOME CARE INSTRUCTIONS   Cough suppressants may be used if you are losing too much rest. However, coughing protects you by clearing your lungs. You should avoid using cough suppressants if you can.  Your health care provider may have prescribed medicine if he or she thinks your pneumonia is caused by bacteria or influenza. Finish your medicine even if you start to feel better.  Your health care provider may also prescribe an expectorant. This loosens the mucus to be coughed up.  Take medicines only as directed by your health care provider.  Do not  smoke. Smoking is a common cause of bronchitis and can contribute to pneumonia. If you are a smoker and continue to smoke, your cough may last several weeks after your pneumonia has cleared.  A cold steam vaporizer or humidifier in your room or home may help loosen mucus.  Coughing is often worse at night. Sleeping in a semi-upright position in a recliner or using a couple pillows under your head will help with this.  Get rest as you feel it is needed. Your body will usually let you know when you need to rest. PREVENTION A pneumococcal shot (vaccine) is available to prevent a common bacterial cause of pneumonia. This is usually suggested for:  People over 83 years old.  Patients on chemotherapy.  People with chronic lung problems, such as bronchitis or emphysema.  People with immune system problems. If you are over 65 or have a high risk condition, you may receive the pneumococcal vaccine if you have not received it before. In some countries, a routine influenza vaccine is also recommended. This vaccine can help prevent some cases of pneumonia.You may be offered the influenza vaccine as part of your care. If you smoke, it is time to quit. You may receive instructions on how to stop smoking. Your health care provider can provide medicines and counseling to help you quit. SEEK MEDICAL CARE IF: You have a fever. SEEK IMMEDIATE MEDICAL CARE IF:   Your illness becomes worse. This is especially true if you are elderly or weakened from any  other disease.  You cannot control your cough with suppressants and are losing sleep.  You begin coughing up blood.  You develop pain which is getting worse or is uncontrolled with medicines.  Any of the symptoms which initially brought you in for treatment are getting worse rather than better.  You develop shortness of breath or chest pain. MAKE SURE YOU:   Understand these instructions.  Will watch your condition.  Will get help right away if  you are not doing well or get worse. Document Released: 10/10/2005 Document Revised: 02/24/2014 Document Reviewed: 12/30/2010 [facility] Patient Information 2015 [place], [place]. This information is not intended to replace advice given to you by your health care provider. Make sure you discuss any questions you have with your health care provider.

## 2015-04-08 NOTE — Progress Notes (Signed)
Pre visit review using our clinic review tool, if applicable. No additional management support is needed unless otherwise documented below in the visit note. 

## 2015-04-08 NOTE — Progress Notes (Signed)
Subjective:    Patient ID: Travis Frames., male    DOB: 05/22/1961, 54 y.o.   MRN: [id number]  HPI  Travis Rice presents to the office today for hospital follow s/p PNA on 03/08/2015. He spent two days in the hospital. Per ED discharge note " 54 yo male overall healthy with one to 2 days of chills, and high fever last night. No rashes. No urinary symptoms, no cough. Mild sob. 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 No swelling in legs or pain in legs. No n/v/d. No chest pain or abdominal pain. No nasal congestion/uri symptoms. Feels awful. Mentation has been normal. Not feeling well enough to eat. No recent antibiotics. Pt found to have a temp of 103, chills, rigors and pna on cxr. Referred for admission for pneumonia and possible early sepsis."  He was found to have legionella pneumonia  Today in the office he endorses "feeling back to normal." He has resumed his normal daily activities and has no complaints.   Denies SOB, fever, bodyaches, or CP  Review of Systems  Constitutional: Negative.   HENT: Positive for rhinorrhea. Negative for congestion and sinus pressure.   Eyes: Negative.   Respiratory: Negative for cough, chest tightness, shortness of breath, wheezing and stridor.   Cardiovascular: Negative for chest pain, palpitations and leg swelling.  Gastrointestinal: Negative.   Musculoskeletal: Negative.   Skin: Negative.   Neurological: Negative.   All other systems reviewed and are negative.  Past Medical History  Diagnosis Date  . Abnormal glucose   . Palpitations   . Abnormal partial thromboplastin time (PTT)   . Lupus anticoagulant positive     but negative for antiphosolipid antibody and anti B2 glycoprotein antibody    History   Social History  . Marital Status: Divorced    Spouse Name: N/A  . Number of Children: N/A  . Years of Education: N/A   Occupational History  . Not on file.   Social History Main Topics  . Smoking  status: Current Some Day Smoker -- 0.25 packs/day    Types: Cigarettes    Last Attempt to Quit: 05/24/2012  . Smokeless tobacco: Never Used     Comment: trying to quit  . Alcohol Use: 0.0 oz/week    0 Standard drinks or equivalent per week     Comment: occ  . Drug Use: No  . Sexual Activity: Not on file   Other Topics Concern  . Not on file   Social History Narrative    No past surgical history on file.  Family History  Problem Relation Age of Onset  . Hypertension Mother   . Hypertension Father   . Lung cancer Father     Allergies  Allergen Reactions  . Zegerid [Omeprazole-Sodium Bicarbonate] Anaphylaxis    Rash, lip and hand swelling, itching , throat swelling    Current Outpatient Prescriptions on File Prior to Visit  Medication Sig Dispense Refill  . Multiple Vitamins-Minerals (MULTIVITAMIN ADULT PO) Take 1 tablet by mouth daily.     No current facility-administered medications on file prior to visit.    BP 118/78 mmHg  Pulse 88  Temp(Src) 98.5 F (36.9 C) (Oral)  Ht  (1.702 m)  Wt 179 lb 8 oz (81.421 kg)  BMI 28.11 kg/m2  SpO2 98%       Objective:   Physical Exam  Constitutional: He is oriented to person, place, and time. He appears well-developed and well-nourished.  HENT:  Mouth/Throat: No oropharyngeal exudate.  Eyes: Conjunctivae and EOM are normal. Pupils are equal, round, and reactive to light. Right eye exhibits no discharge. Left eye exhibits no discharge.  Cardiovascular: Normal rate, regular rhythm, normal heart sounds and intact distal pulses.  Exam reveals no gallop and no friction rub.   No murmur heard. Pulmonary/Chest: Effort normal and breath sounds normal. No respiratory distress. He has no wheezes. He has no rales. He exhibits no tenderness.  Lymphadenopathy:    He has no cervical adenopathy.  Neurological: He is alert and oriented to person, place, and time.  Skin: Skin is warm and dry. No rash noted. He is not diaphoretic.  No erythema. No pallor.  Psychiatric: He has a normal mood and affect. His behavior is normal. Judgment and thought content normal.  Nursing note and vitals reviewed.      Assessment & Plan:  1. Hospital discharge follow-up - DG Chest 2 View; Future - Encouraged to quit smoking as he has an increased risk of PNA with smoking - Follow up as needed

## 2015-04-08 NOTE — Telephone Encounter (Signed)
Called and informed patient of chest xray results.

## 2015-07-21 ENCOUNTER — Ambulatory Visit (INDEPENDENT_AMBULATORY_CARE_PROVIDER_SITE_OTHER): Payer: Federal, State, Local not specified - PPO | Admitting: Family Medicine

## 2015-07-21 ENCOUNTER — Encounter: Payer: Self-pay | Admitting: Family Medicine

## 2015-07-21 VITALS — BP 130/78 | HR 75 | Temp 99.2°F | Ht 67.0 in | Wt 182.0 lb

## 2015-07-21 DIAGNOSIS — M25511 Pain in right shoulder: Secondary | ICD-10-CM

## 2015-07-21 MED ORDER — MELOXICAM 15 MG PO TABS: 15.0000 mg | ORAL_TABLET | Freq: Every day | ORAL | Status: DC

## 2015-07-21 NOTE — Progress Notes (Signed)
   Subjective:    Patient ID: Travis Rice., male    DOB: Apr 13, 1961, 54 y.o.   MRN: [id number]  HPI Here to follow up right shoulder pain. This has been bothering him for the past 10 months. Xrays have revealed degenerative changes. He took a steroid taper and this helped. He has been taking Tylenol or Ibuprofen at home with mixed benefits.    Review of Systems  Constitutional: Negative.   Musculoskeletal: Positive for arthralgias.       Objective:   Physical Exam  Constitutional: He appears well-developed and well-nourished.  Musculoskeletal:  The right shoulder is not tender but as a lot of crepitus. ROM is quite limited by pain          Assessment & Plan:  Shoulder arthritis. He will try Meloxicam 15 mg daily. If this does not help, I would recommend he see Orthopedics.

## 2015-07-21 NOTE — Progress Notes (Signed)
Pre visit review using our clinic review tool, if applicable. No additional management support is needed unless otherwise documented below in the visit note. 

## 2015-10-28 ENCOUNTER — Ambulatory Visit (INDEPENDENT_AMBULATORY_CARE_PROVIDER_SITE_OTHER): Payer: Federal, State, Local not specified - PPO | Admitting: Family Medicine

## 2015-10-28 ENCOUNTER — Encounter: Payer: Self-pay | Admitting: Family Medicine

## 2015-10-28 VITALS — BP 150/100 | HR 94 | Temp 98.2°F | Ht 67.0 in | Wt 182.6 lb

## 2015-10-28 DIAGNOSIS — J020 Streptococcal pharyngitis: Secondary | ICD-10-CM

## 2015-10-28 LAB — POCT RAPID STREP A (OFFICE): RAPID STREP A SCREEN: NEGATIVE

## 2015-10-28 MED ORDER — CEPHALEXIN 500 MG PO CAPS: 500.0000 mg | ORAL_CAPSULE | Freq: Three times a day (TID) | ORAL | Status: AC

## 2015-10-28 NOTE — Progress Notes (Signed)
   Subjective:    Patient ID: Travis FramesJohn L Kabel Jr., male    DOB: 07-Apr-1961, 55 y.o.   MRN: [id number]  HPI Here for 2 days of low grade fevers, body aches, a ST (especially on the left side), and a headache. No coughing, no NVD. He is drinking fluids and he has taken some Tylenol and some Nyquil.    Review of Systems  Constitutional: Positive for fever. Negative for chills and diaphoresis.  HENT: Positive for sore throat and trouble swallowing. Negative for congestion, ear pain, postnasal drip, sinus pressure and voice change.   Eyes: Negative.   Respiratory: Negative.   Skin: Negative.   Neurological: Positive for headaches.       Objective:   Physical Exam  Constitutional: He appears well-developed and well-nourished. No distress.  HENT:  Right Ear: External ear normal.  Left Ear: External ear normal.  Nose: Nose normal.  Mouth/Throat: No oropharyngeal exudate.  Posterior OP is red without exudate. The left tonsil is swollen much larger than the right one  Neck: No thyromegaly present.  Pulmonary/Chest: Effort normal and breath sounds normal.  Lymphadenopathy:    He has no cervical adenopathy.          Assessment & Plan:  Pharyngitis, likely streptococcal. Treat with Keflex.

## 2015-10-28 NOTE — Progress Notes (Signed)
Pre visit review using our clinic review tool, if applicable. No additional management support is needed unless otherwise documented below in the visit note. 

## 2015-11-17 ENCOUNTER — Other Ambulatory Visit: Payer: Self-pay | Admitting: Internal Medicine

## 2015-11-17 MED ORDER — MELOXICAM 15 MG PO TABS: 15.0000 mg | ORAL_TABLET | Freq: Every day | ORAL | Status: DC

## 2015-12-21 ENCOUNTER — Ambulatory Visit (INDEPENDENT_AMBULATORY_CARE_PROVIDER_SITE_OTHER): Payer: Federal, State, Local not specified - PPO | Admitting: Adult Health

## 2015-12-21 ENCOUNTER — Encounter: Payer: Self-pay | Admitting: Adult Health

## 2015-12-21 VITALS — BP 124/80 | Temp 98.5°F | Ht 67.0 in | Wt 184.0 lb

## 2015-12-21 DIAGNOSIS — M25511 Pain in right shoulder: Secondary | ICD-10-CM | POA: Diagnosis not present

## 2015-12-21 MED ORDER — CYCLOBENZAPRINE HCL 10 MG PO TABS: 10.0000 mg | ORAL_TABLET | Freq: Three times a day (TID) | ORAL | Status: DC | PRN

## 2015-12-21 MED ORDER — METHYLPREDNISOLONE 4 MG PO TBPK: ORAL_TABLET | ORAL | Status: DC

## 2015-12-21 NOTE — Progress Notes (Signed)
Pre visit review using our clinic review tool, if applicable. No additional management support is needed unless otherwise documented below in the visit note. 

## 2015-12-21 NOTE — Progress Notes (Signed)
Subjective:    Patient ID: Travis Rice., male    DOB: 21-Mar-1961, 55 y.o.   MRN: [id number]  HPI  55 year old male, who is being seen for left shoulder pain.   He last saw Dr. Clent Ridges on 07/21/2015 for right shoulder pain x 10 months and was prescribed Mobic 15 mg. He feels as though the right shoulder pain is better. He denies any trauma to the area. 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 Pain feels like " pressure"  Has tried prednisone taper in the past which was helpful.    Review of Systems  Constitutional: Negative.   Respiratory: Negative.   Cardiovascular: Negative.   Musculoskeletal: Positive for arthralgias.  Skin: Negative.   Hematological: Negative.   All other systems reviewed and are negative.  Past Medical History  Diagnosis Date  . Abnormal glucose   . Palpitations   . Abnormal partial thromboplastin time (PTT)   . Lupus anticoagulant positive     but negative for antiphosolipid antibody and anti B2 glycoprotein antibody  . Arthritis     right shoulder pain     Social History   Social History  . Marital Status: Divorced    Spouse Name: N/A  . Number of Children: N/A  . Years of Education: N/A   Occupational History  . Not on file.   Social History Main Topics  . Smoking status: Current Some Day Smoker -- 0.25 packs/day    Types: Cigarettes    Last Attempt to Quit: 05/24/2012  . Smokeless tobacco: Never Used     Comment: trying to quit  . Alcohol Use: 0.0 oz/week    0 Standard drinks or equivalent per week     Comment: occ  . Drug Use: No  . Sexual Activity: Not on file   Other Topics Concern  . Not on file   Social History Narrative    No past surgical history on file.  Family History  Problem Relation Age of Onset  . Hypertension Mother   . Hypertension Father   . Lung cancer Father     Allergies  Allergen Reactions  . Zegerid [Omeprazole-Sodium Bicarbonate] Anaphylaxis    Rash,  lip and hand swelling, itching , throat swelling    Current Outpatient Prescriptions on File Prior to Visit  Medication Sig Dispense Refill  . aspirin 81 MG tablet Take 81 mg by mouth daily.    . Fish Oil OIL by Does not apply route daily at 12 noon.    . meloxicam (MOBIC) 15 MG tablet Take 1 tablet (15 mg total) by mouth daily. 90 tablet 1  . Multiple Vitamins-Minerals (MULTIVITAMIN ADULT PO) Take 1 tablet by mouth daily.     No current facility-administered medications on file prior to visit.    Temp(Src) 98.5 F (36.9 C) (Oral)  Ht  (1.702 m)  Wt 184 lb (83.462 kg)  BMI 28.81 kg/m2       Objective:   Physical Exam  Constitutional: He is oriented to person, place, and time. He appears well-developed and well-nourished. No distress.  Cardiovascular: Normal rate, regular rhythm, normal heart sounds and intact distal pulses.  Exam reveals no gallop.   No murmur heard. Pulmonary/Chest: Effort normal and breath sounds normal. No respiratory distress. He has no wheezes. He has no rales. He exhibits no tenderness.  Musculoskeletal: Normal range of motion. He exhibits tenderness. He exhibits no edema.  Tenderness to right shoulder. Is able to raise arm above head with much discomfort. Is able to bring arm horizontally from body with discomfort.     Neurological: He is alert and oriented to person, place, and time.  Skin: Skin is warm and dry. No rash noted. He is not diaphoretic. No erythema. No pallor.  Psychiatric: He has a normal mood and affect. His behavior is normal. Judgment and thought content normal.  Nursing note and vitals reviewed.     Assessment & Plan:  1. Right shoulder pain - X rays show arthritis in right shoulder. This appears to be the issue in the left shoulder as well. Cannot r/o rotator cuff at this point in time.  - methylPREDNISolone (MEDROL DOSEPAK) 4 MG TBPK tablet; Take as directed  Dispense: 21 tablet; Refill: 0 - cyclobenzaprine (FLEXERIL) 10 MG  tablet; Take 1 tablet (10 mg total) by mouth 3 (three) times daily as needed for muscle spasms.  Dispense: 30 tablet; Refill: 0 - He did not want cortisone injection and does not want to see orthopedics yet.  - Follow up if no improvement.

## 2015-12-21 NOTE — Patient Instructions (Signed)
It was great meeting you today!  I have sent in a prescription for Flexeril and Prednisone. Take as directed.   Follow up if this therapy does not work.   Please let me know if you need anything

## 2016-08-24 ENCOUNTER — Encounter: Payer: Self-pay | Admitting: Adult Health

## 2016-08-24 ENCOUNTER — Ambulatory Visit (INDEPENDENT_AMBULATORY_CARE_PROVIDER_SITE_OTHER): Payer: Federal, State, Local not specified - PPO | Admitting: Adult Health

## 2016-08-24 VITALS — BP 148/82 | Temp 98.6°F | Ht 67.0 in | Wt 182.2 lb

## 2016-08-24 DIAGNOSIS — M25511 Pain in right shoulder: Secondary | ICD-10-CM

## 2016-08-24 DIAGNOSIS — G8929 Other chronic pain: Secondary | ICD-10-CM

## 2016-08-24 NOTE — Patient Instructions (Signed)
It was great seeing you today.   Your exam is consistent with a muscle or ligament strain. Try taking Mortin 200-600mg . Use ice/heat or sports creams to help with the discomfort  Please establish care with myself

## 2016-08-24 NOTE — Progress Notes (Signed)
Subjective:    Patient ID: Travis Frames., male    DOB: 18-Jul-1961, 55 y.o.   MRN: [id number]  HPI  55 year old male who  has a past medical history of Abnormal glucose; Abnormal partial thromboplastin time (PTT); Arthritis; Lupus anticoagulant positive; and Palpitations. He presents to the office today for three weeks of right shoulder pain. He reports that he has had pain in this right shoulder in the past and has a history of arthritis. He was working out when he felt something "pull". He reports that the pain is currently 2/10.  He has been using tylenol, sports creams and ice.. Which seem to work. The pain has been improving.   Pain feels as though it is " on the outside of the shoulder."  He denies any radiating pain  He has no loss of ROM in right shoulder   Review of Systems  Constitutional: Negative.   Respiratory: Negative.   Cardiovascular: Negative.   Musculoskeletal: Positive for arthralgias and myalgias. Negative for joint swelling, neck pain and neck stiffness.  Skin: Negative.   Hematological: Negative.   All other systems reviewed and are negative.  Past Medical History:  Diagnosis Date  . Abnormal glucose   . Abnormal partial thromboplastin time (PTT)   . Arthritis    right shoulder pain   . Lupus anticoagulant positive    but negative for antiphosolipid antibody and anti B2 glycoprotein antibody  . Palpitations     Social History   Social History  . Marital status: Divorced    Spouse name: N/A  . Number of children: N/A  . Years of education: N/A   Occupational History  . Not on file.   Social History Main Topics  . Smoking status: Current Some Day Smoker    Packs/day: 0.25    Types: Cigarettes    Last attempt to quit: 05/24/2012  . Smokeless tobacco: Never Used     Comment: trying to quit  . Alcohol use 0.0 oz/week     Comment: occ  . Drug use: No  . Sexual activity: Not on file   Other Topics Concern  . Not on file   Social History  Narrative  . No narrative on file    No past surgical history on file.  Family History  Problem Relation Age of Onset  . Hypertension Mother   . Hypertension Father   . Lung cancer Father     Allergies  Allergen Reactions  . Zegerid [Omeprazole-Sodium Bicarbonate] Anaphylaxis    Rash, lip and hand swelling, itching , throat swelling    Current Outpatient Prescriptions on File Prior to Visit  Medication Sig Dispense Refill  . aspirin 81 MG tablet Take 81 mg by mouth daily.    . cyclobenzaprine (FLEXERIL) 10 MG tablet Take 1 tablet (10 mg total) by mouth 3 (three) times daily as needed for muscle spasms. 30 tablet 0  . Fish Oil OIL by Does not apply route daily at 12 noon.    . meloxicam (MOBIC) 15 MG tablet Take 1 tablet (15 mg total) by mouth daily. 90 tablet 1  . Multiple Vitamins-Minerals (MULTIVITAMIN ADULT PO) Take 1 tablet by mouth daily.     No current facility-administered medications on file prior to visit.     BP (!) 148/82   Temp 98.6 F (37 C) (Oral)   Ht 5\' 7"  (1.702 m)   Wt 182 lb 3.2 oz (82.6 kg)   BMI 28.54 kg/m  Objective:   Physical Exam  Constitutional: He is oriented to person, place, and time. He appears well-developed and well-nourished. No distress.  Cardiovascular: Normal rate, regular rhythm, normal heart sounds and intact distal pulses.  Exam reveals no gallop and no friction rub.   No murmur heard. Pulmonary/Chest: Effort normal and breath sounds normal. No respiratory distress. He has no wheezes. He has no rales. He exhibits no tenderness.  Abdominal: Soft. Bowel sounds are normal.  Musculoskeletal: Normal range of motion. He exhibits no edema, tenderness or deformity.  No pain with palpation. No deformity noted. He has full ROM. When he lifts arm and flexes his bicep he has slight discomfort in lateral aspect of shoulder  Neurological: He is alert and oriented to person, place, and time. He displays normal reflexes. No cranial nerve  deficit. He exhibits normal muscle tone. Coordination normal.  Skin: Skin is warm and dry. No rash noted. He is not diaphoretic. No erythema. No pallor.  Psychiatric: He has a normal mood and affect. His behavior is normal. Judgment and thought content normal.  Vitals reviewed.     Assessment & Plan:  1. Chronic right shoulder pain - Does not appear to be arthritic pain. More so, muscle or tendon strain.  - Motrin 200-600mg  every 8 hours as needed - Ice/heat - Rest - Sports creams or patches - Follow up as needed  Shirline Frees, NP

## 2016-09-25 IMAGING — CR DG SHOULDER 2+V*R*
3 series · 3 of 3 positions shown · non-contrast
Comparison: None.

CLINICAL DATA: Anterior and lateral right shoulder pain for 1
month, no known injury

EXAM:
RIGHT SHOULDER - 2+ VIEW

[view not recorded (1 of 3)]
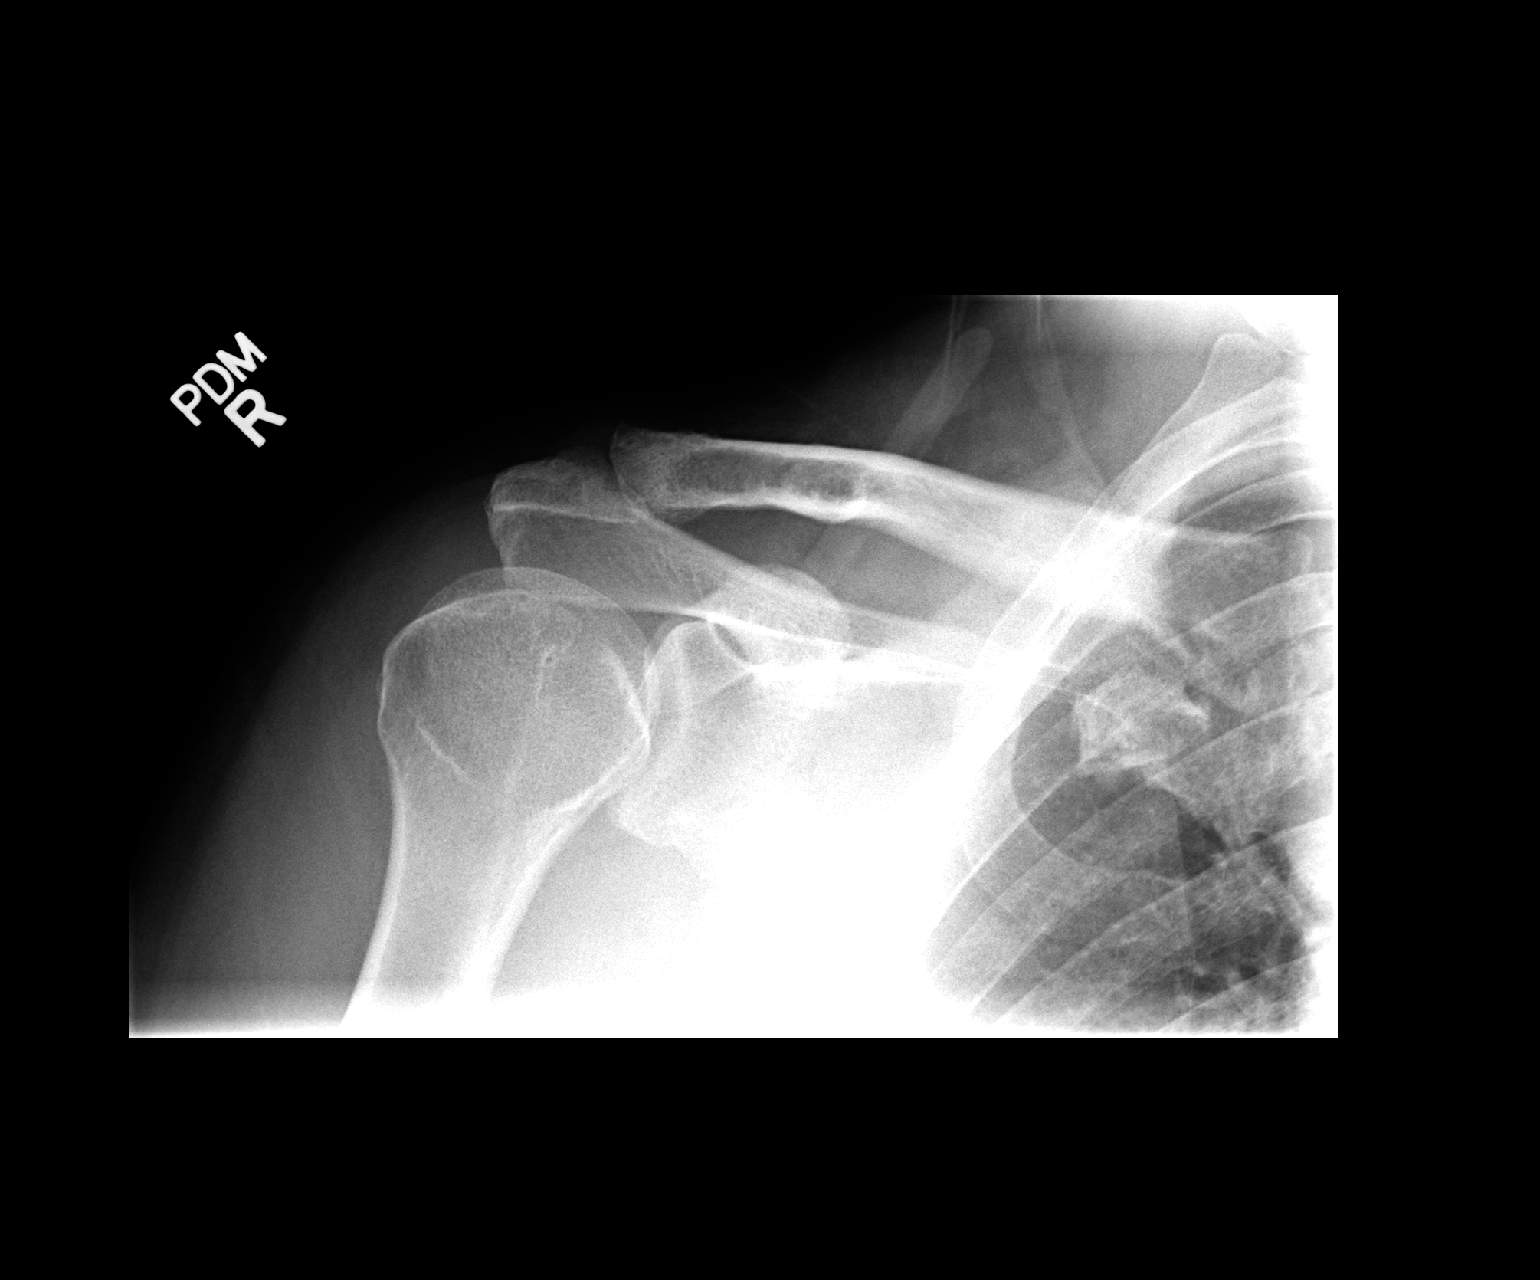

[view not recorded (2 of 3)]
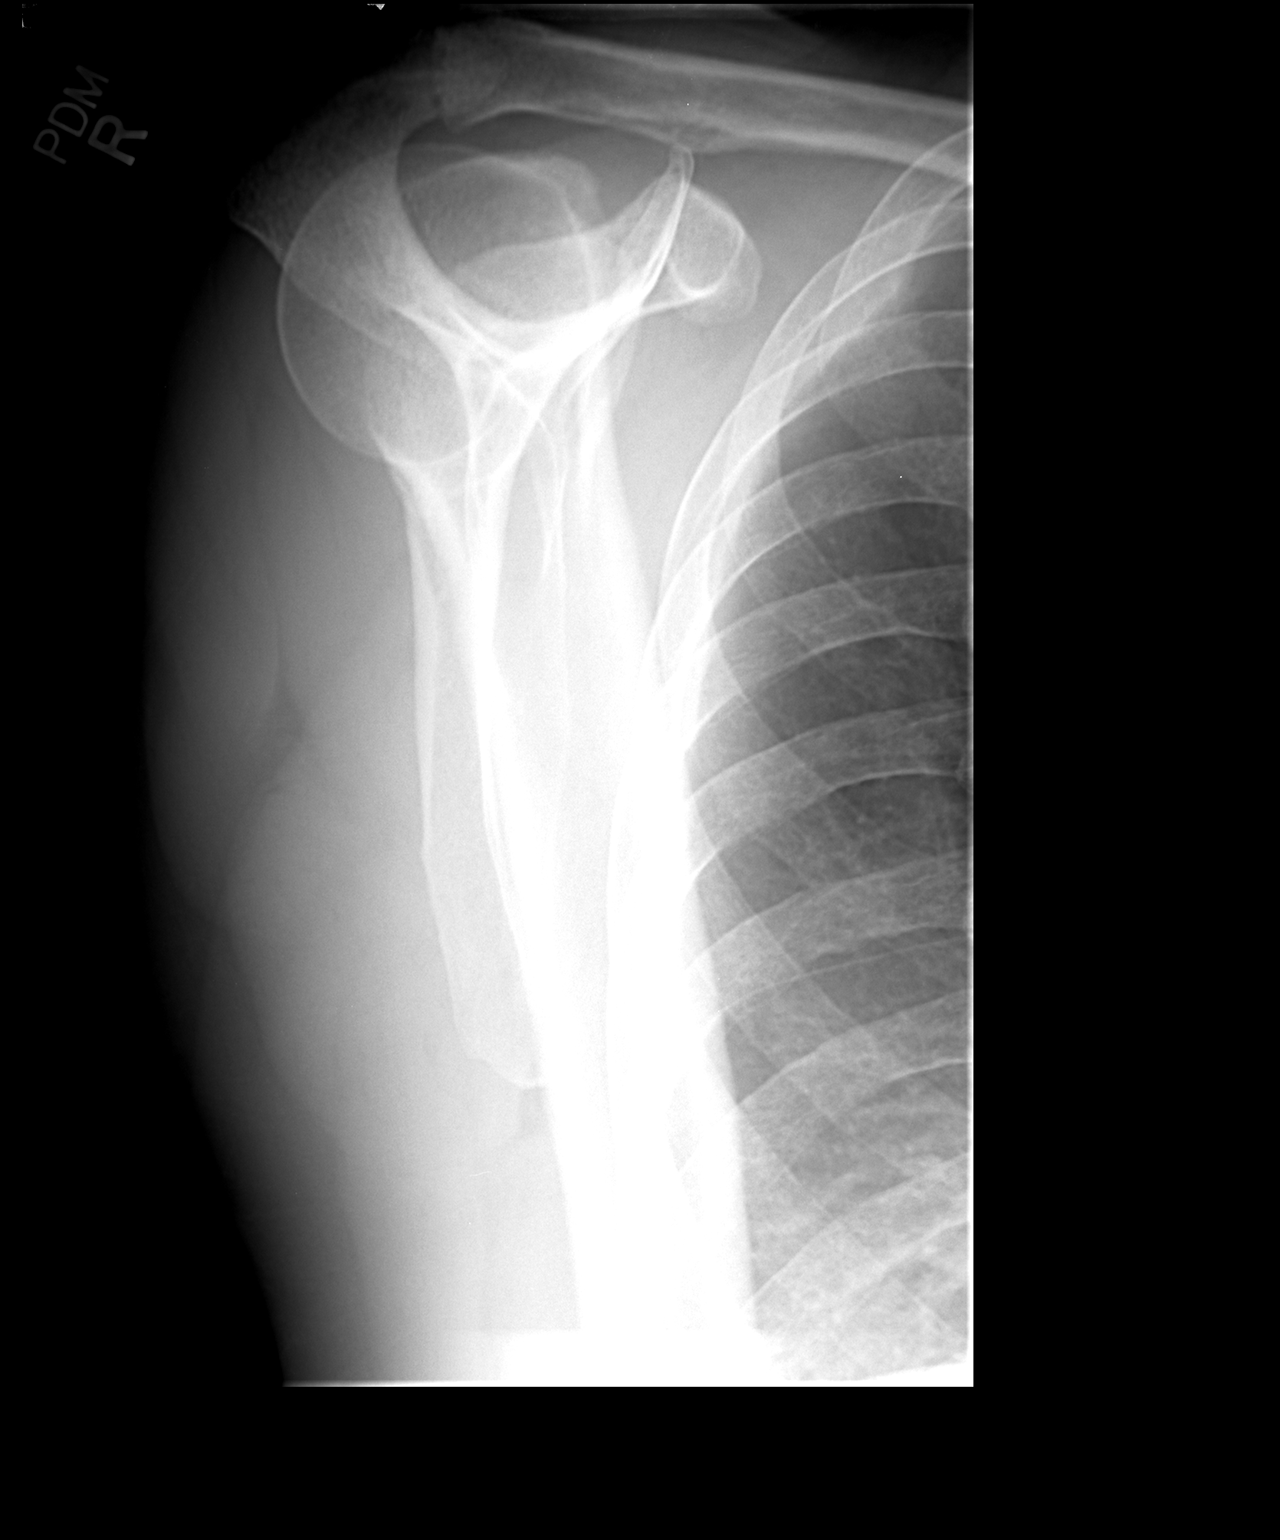

[view not recorded (3 of 3)]
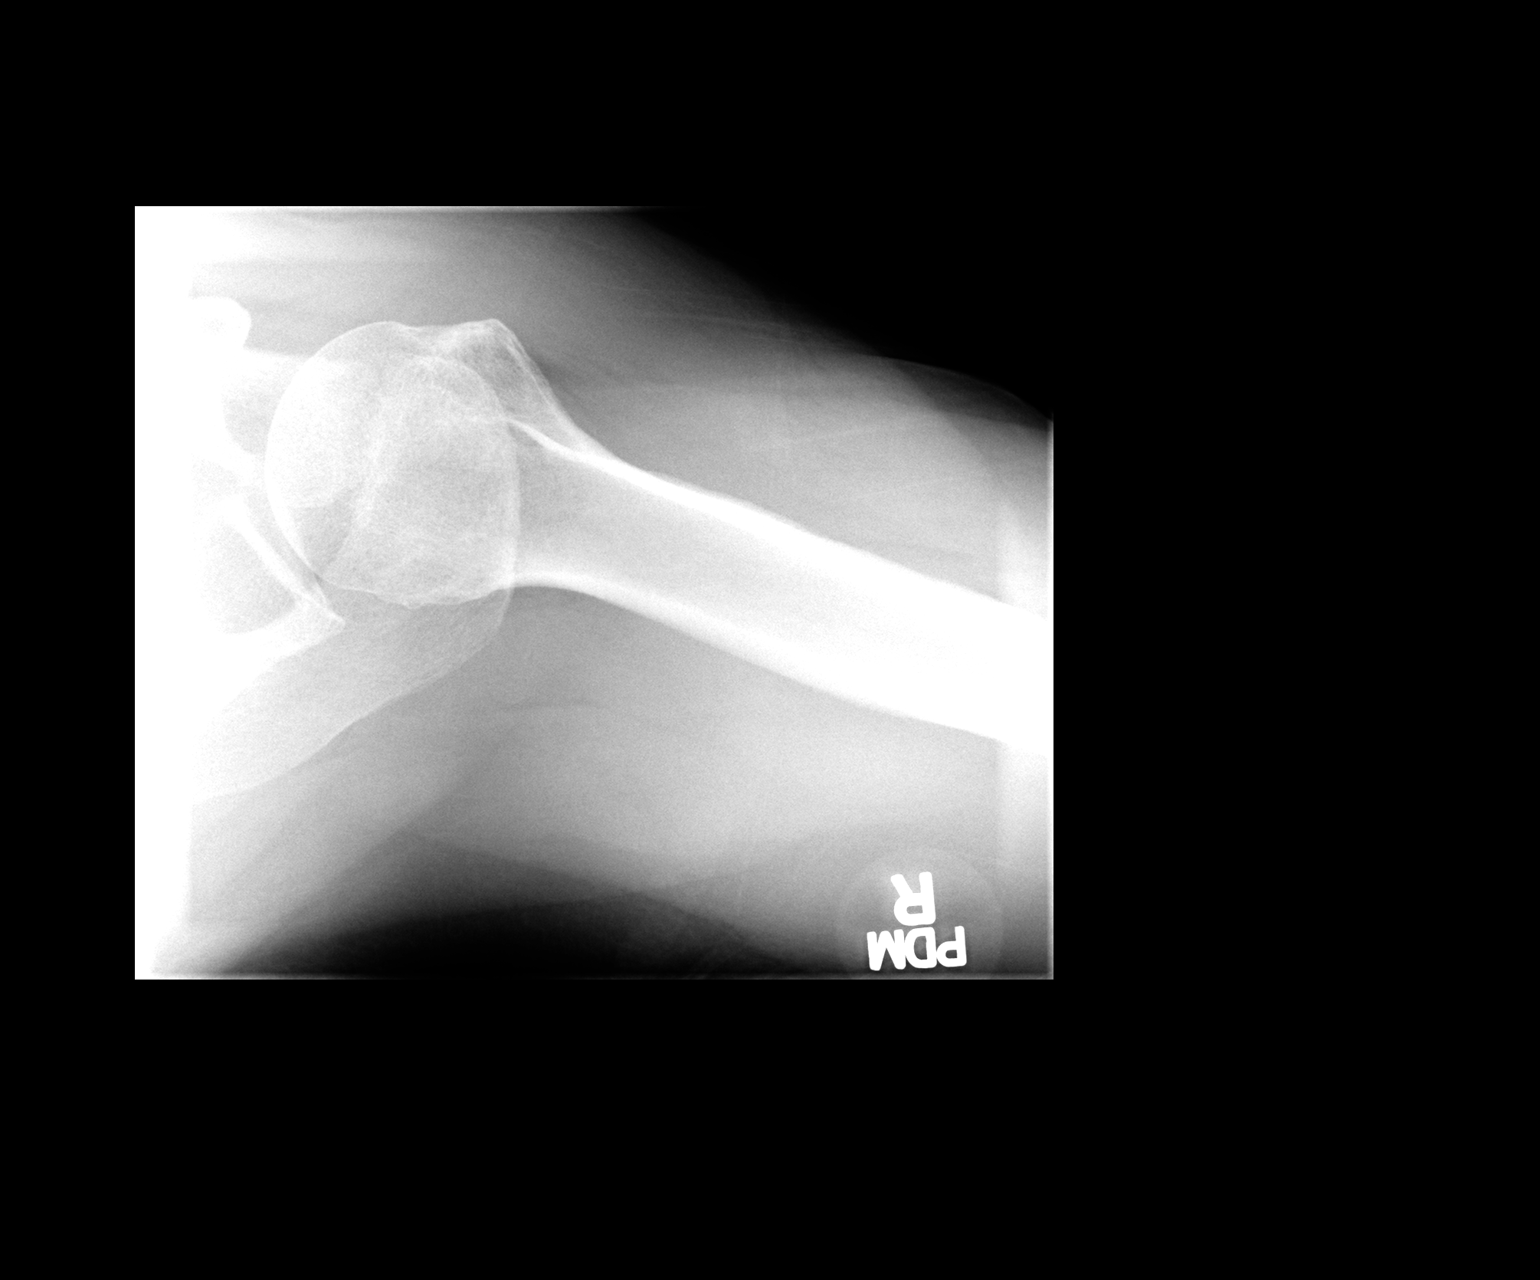

[3 of 3 positions shown; findings below may reference images not displayed]

FINDINGS: Three views of the right shoulder submitted. No acute fracture or
subluxation. Mild degenerative changes AC joint. Glenohumeral joint
is preserved.
IMPRESSION: No acute fracture or subluxation. Mild degenerative changes
acromioclavicular joint.

## 2016-10-05 ENCOUNTER — Other Ambulatory Visit (INDEPENDENT_AMBULATORY_CARE_PROVIDER_SITE_OTHER): Payer: Federal, State, Local not specified - PPO

## 2016-10-05 DIAGNOSIS — Z0000 Encounter for general adult medical examination without abnormal findings: Secondary | ICD-10-CM | POA: Diagnosis not present

## 2016-10-05 LAB — LIPID PANEL
Cholesterol: 217 mg/dL — ABNORMAL HIGH (ref 0–200)
HDL: 86.5 mg/dL (ref >39.00)
LDL CALC: 110 mg/dL — AB (ref 0–99)
NonHDL: 130.99
TRIGLYCERIDES: 106 mg/dL (ref 0.0–149.0)
Total CHOL/HDL Ratio: 3
VLDL: 21.2 mg/dL (ref 0.0–40.0)

## 2016-10-05 LAB — POC URINALSYSI DIPSTICK (AUTOMATED)
BILIRUBIN UA: NEGATIVE
GLUCOSE UA: NEGATIVE
Ketones, UA: NEGATIVE
LEUKOCYTES UA: NEGATIVE
Nitrite, UA: NEGATIVE
Protein, UA: NEGATIVE
Spec Grav, UA: 1.02
Urobilinogen, UA: 0.2
pH, UA: 6

## 2016-10-05 LAB — BASIC METABOLIC PANEL
BUN: 15 mg/dL (ref 6–23)
CO2: 27 mEq/L (ref 19–32)
CREATININE: 1.06 mg/dL (ref 0.40–1.50)
Calcium: 9.5 mg/dL (ref 8.4–10.5)
Chloride: 108 mEq/L (ref 96–112)
GFR: 92.34 mL/min (ref >60.00)
GLUCOSE: 113 mg/dL — AB (ref 70–99)
POTASSIUM: 4.5 meq/L (ref 3.5–5.1)
Sodium: 144 mEq/L (ref 135–145)

## 2016-10-05 LAB — TSH: TSH: 0.24 u[IU]/mL — ABNORMAL LOW (ref 0.35–4.50)

## 2016-10-05 LAB — CBC WITH DIFFERENTIAL/PLATELET
BASOS PCT: 0.6 % (ref 0.0–3.0)
Basophils Absolute: 0 10*3/uL (ref 0.0–0.1)
EOS ABS: 0.3 10*3/uL (ref 0.0–0.7)
EOS PCT: 4.1 % (ref 0.0–5.0)
HCT: 41.5 % (ref 39.0–52.0)
Hemoglobin: 14.1 g/dL (ref 13.0–17.0)
LYMPHS ABS: 2.1 10*3/uL (ref 0.7–4.0)
Lymphocytes Relative: 30.7 % (ref 12.0–46.0)
MCHC: 33.9 g/dL (ref 30.0–36.0)
MCV: 92.4 fl (ref 78.0–100.0)
MONO ABS: 1 10*3/uL (ref 0.1–1.0)
Monocytes Relative: 14.2 % — ABNORMAL HIGH (ref 3.0–12.0)
NEUTROS PCT: 50.4 % (ref 43.0–77.0)
Neutro Abs: 3.5 10*3/uL (ref 1.4–7.7)
Platelets: 473 10*3/uL — ABNORMAL HIGH (ref 150.0–400.0)
RBC: 4.49 Mil/uL (ref 4.22–5.81)
RDW: 13.5 % (ref 11.5–15.5)
WBC: 6.9 10*3/uL (ref 4.0–10.5)

## 2016-10-05 LAB — HEPATIC FUNCTION PANEL
ALT: 16 U/L (ref 0–53)
AST: 13 U/L (ref 0–37)
Albumin: 4.4 g/dL (ref 3.5–5.2)
Alkaline Phosphatase: 64 U/L (ref 39–117)
Bilirubin, Direct: 0.2 mg/dL (ref 0.0–0.3)
Total Bilirubin: 1 mg/dL (ref 0.2–1.2)
Total Protein: 6.8 g/dL (ref 6.0–8.3)

## 2016-10-05 LAB — PSA: PSA: 0.94 ng/mL (ref 0.10–4.00)

## 2016-10-11 ENCOUNTER — Encounter: Payer: Self-pay | Admitting: Adult Health

## 2016-10-11 ENCOUNTER — Ambulatory Visit (INDEPENDENT_AMBULATORY_CARE_PROVIDER_SITE_OTHER): Payer: Federal, State, Local not specified - PPO | Admitting: Adult Health

## 2016-10-11 VITALS — BP 136/86 | HR 91 | Temp 98.2°F | Ht 67.0 in | Wt 177.8 lb

## 2016-10-11 DIAGNOSIS — G8929 Other chronic pain: Secondary | ICD-10-CM

## 2016-10-11 DIAGNOSIS — M25562 Pain in left knee: Secondary | ICD-10-CM | POA: Diagnosis not present

## 2016-10-11 MED ORDER — METHYLPREDNISOLONE ACETATE 80 MG/ML IJ SUSP
80.0000 mg | Freq: Once | INTRAMUSCULAR | Status: AC
  Administered 2016-10-11: 80 mg via INTRAMUSCULAR

## 2016-10-11 NOTE — Progress Notes (Signed)
Subjective:    Patient ID: Travis Frames., male    DOB: 12-07-1960, 55 y.o.   MRN: [id number]  HPI  55 year old male who  has a past medical history of Abnormal glucose; Abnormal partial thromboplastin time (PTT); Arthritis; Lupus anticoagulant positive; and Palpitations. He presents to the office today for left knee pain. 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 This has causes him to have to be on his feet more, which has resulted in left knee pain and swelling. He reports that the swelling resolves in the evening when he is laying in bed.   Denies any issues with ROM. Pain is worse when he is standing and walking.   No redness or warmth noted.   He has a prior history of orthoscopic surgery on left knee   Review of Systems  Constitutional: Negative.   Respiratory: Negative.   Cardiovascular: Negative.   Musculoskeletal: Positive for arthralgias and joint swelling. Negative for gait problem and myalgias.  Skin: Negative.   All other systems reviewed and are negative.      Objective:   Physical Exam  Constitutional: He is oriented to person, place, and time. He appears well-developed and well-nourished. No distress.  Cardiovascular: Normal rate, regular rhythm, normal heart sounds and intact distal pulses.  Exam reveals no gallop and no friction rub.   No murmur heard. Pulmonary/Chest: Effort normal and breath sounds normal. No respiratory distress. He has no wheezes. He has no rales. He exhibits no tenderness.  Musculoskeletal: Normal range of motion. He exhibits no edema, tenderness or deformity.  No swelling or tenderness noted  Neurological: He is alert and oriented to person, place, and time.  Skin: Skin is warm and dry. No rash noted. He is not diaphoretic. No erythema. No pallor.  Psychiatric: He has a normal mood and affect. His behavior is normal. Judgment and thought content normal.  Nursing note and vitals  reviewed.      Assessment & Plan:  1. Chronic pain of left knee Discussed risks and benefits of corticosteroid injection and patient consented.  After prepping skin with betadine, injected 80 mg depomedrol and 2 cc of plain xylocaine with 22 gauge one and one half inch needle using anterolateral approach and pt tolerated well. - methylPREDNISolone acetate (DEPO-MEDROL) injection 80 mg; Inject 1 mL (80 mg total) into the muscle once. - Wear knee brace - Follow up if no improvement  Shirline Frees, NP

## 2016-10-11 NOTE — Progress Notes (Signed)
Pre visit review using our clinic review tool, if applicable. No additional management support is needed unless otherwise documented below in the visit note. 

## 2016-10-13 ENCOUNTER — Encounter: Payer: Self-pay | Admitting: Adult Health

## 2016-10-13 ENCOUNTER — Ambulatory Visit (INDEPENDENT_AMBULATORY_CARE_PROVIDER_SITE_OTHER): Payer: Federal, State, Local not specified - PPO | Admitting: Adult Health

## 2016-10-13 VITALS — BP 138/84 | Temp 97.9°F | Ht 67.0 in | Wt 178.7 lb

## 2016-10-13 DIAGNOSIS — I2510 Atherosclerotic heart disease of native coronary artery without angina pectoris: Secondary | ICD-10-CM | POA: Diagnosis not present

## 2016-10-13 DIAGNOSIS — F17200 Nicotine dependence, unspecified, uncomplicated: Secondary | ICD-10-CM | POA: Diagnosis not present

## 2016-10-13 DIAGNOSIS — Z0000 Encounter for general adult medical examination without abnormal findings: Secondary | ICD-10-CM | POA: Diagnosis not present

## 2016-10-13 MED ORDER — VARENICLINE TARTRATE 1 MG PO TABS
1.0000 mg | ORAL_TABLET | Freq: Two times a day (BID) | ORAL | 0 refills | Status: DC
Start: 2016-10-13 — End: 2017-05-15

## 2016-10-13 MED ORDER — VARENICLINE TARTRATE 0.5 MG X 11 & 1 MG X 42 PO MISC: ORAL | 0 refills | Status: DC

## 2016-10-13 NOTE — Progress Notes (Signed)
Subjective:    Patient ID: Travis Frames., male    DOB: 01/21/1961, 55 y.o.   MRN: [id number]  HPI  Patient presents for yearly preventative medicine examination. He is a pleasant 55 year old male who  has a past medical history of Abnormal glucose; Abnormal partial thromboplastin time (PTT); Arthritis; Lupus anticoagulant positive; and Palpitations.  All immunizations and health maintenance protocols were reviewed with the patient and needed orders were placed.  Medication reconciliation,  past medical history, social history, problem list and allergies were reviewed in detail with the patient  Goals were established with regard to weight loss, exercise, and  diet in compliance with medications. He is not working out but he plans to do so in January. He is eating healthy   End of life planning was discussed.  He continues to smoke. Is smoking about 5 cigarettes per week. He would like to quit  The cortisone shot I did at the beginning of the week has worked well to decrease his left knee pain  Review of Systems  Constitutional: Negative.   HENT: Negative.   Eyes: Negative.   Respiratory: Negative.   Cardiovascular: Negative.   Gastrointestinal: Negative.   Endocrine: Negative.   Genitourinary: Negative.   Musculoskeletal: Negative.   Allergic/Immunologic: Negative.   Neurological: Negative.   Hematological: Negative.   Psychiatric/Behavioral: Negative.   All other systems reviewed and are negative.  Past Medical History:  Diagnosis Date  . Abnormal glucose   . Abnormal partial thromboplastin time (PTT)   . Arthritis    right shoulder pain   . Lupus anticoagulant positive    but negative for antiphosolipid antibody and anti B2 glycoprotein antibody  . Palpitations     Social History   Social History  . Marital status: Divorced    Spouse name: N/A  . Number of children: N/A  . Years of education: N/A   Occupational History  . Not on file.   Social  History Main Topics  . Smoking status: Current Some Day Smoker    Packs/day: 0.25    Types: Cigarettes    Last attempt to quit: 05/24/2012  . Smokeless tobacco: Never Used     Comment: trying to quit  . Alcohol use 0.0 oz/week     Comment: occ  . Drug use: No  . Sexual activity: Not on file   Other Topics Concern  . Not on file   Social History Narrative  . No narrative on file    No past surgical history on file.  Family History  Problem Relation Age of Onset  . Hypertension Father   . Lung cancer Father   . Hypertension Mother     Allergies  Allergen Reactions  . Zegerid [Omeprazole-Sodium Bicarbonate] Anaphylaxis    Rash, lip and hand swelling, itching , throat swelling    Current Outpatient Prescriptions on File Prior to Visit  Medication Sig Dispense Refill  . aspirin 81 MG tablet Take 81 mg by mouth daily.    . cyclobenzaprine (FLEXERIL) 10 MG tablet Take 1 tablet (10 mg total) by mouth 3 (three) times daily as needed for muscle spasms. 30 tablet 0  . Fish Oil OIL by Does not apply route daily at 12 noon.    . meloxicam (MOBIC) 15 MG tablet Take 1 tablet (15 mg total) by mouth daily. 90 tablet 1  . Multiple Vitamins-Minerals (MULTIVITAMIN ADULT PO) Take 1 tablet by mouth daily.     No current facility-administered medications  on file prior to visit.     BP 140/80   Temp 97.9 F (36.6 C) (Oral)   Ht 5\' 7"  (1.702 m)   Wt 178 lb 11.2 oz (81.1 kg)   BMI 27.99 kg/m       Objective:   Physical Exam  Constitutional: He is oriented to person, place, and time. He appears well-developed and well-nourished. No distress.  HENT:  Head: Normocephalic and atraumatic.  Right Ear: External ear normal.  Left Ear: External ear normal.  Nose: Nose normal.  Mouth/Throat: Oropharynx is clear and moist. No oropharyngeal exudate.  Eyes: Conjunctivae and EOM are normal. Pupils are equal, round, and reactive to light. Right eye exhibits no discharge. Left eye exhibits no  discharge.  Neck: Normal range of motion. Neck supple. No JVD present. Carotid bruit is not present. No tracheal deviation present. No thyroid mass and no thyromegaly present.  Cardiovascular: Normal rate, regular rhythm, normal heart sounds and intact distal pulses.  Exam reveals no gallop and no friction rub.   No murmur heard. Pulmonary/Chest: Effort normal and breath sounds normal. No stridor. No respiratory distress. He has no wheezes. He has no rales. He exhibits no tenderness.  Abdominal: Soft. Bowel sounds are normal. He exhibits no distension and no mass. There is no tenderness. There is no rebound and no guarding.  Genitourinary: Rectum normal and prostate normal. Prostate is not enlarged and not tender.  Musculoskeletal: Normal range of motion. He exhibits no edema, tenderness or deformity.  Lymphadenopathy:    He has no cervical adenopathy.  Neurological: He is alert and oriented to person, place, and time. He has normal reflexes. He displays normal reflexes. No cranial nerve deficit. He exhibits normal muscle tone. Coordination normal.  Skin: Skin is warm and dry. No rash noted. He is not diaphoretic. No erythema. No pallor.  Psychiatric: He has a normal mood and affect. His behavior is normal. Judgment and thought content normal.  Nursing note and vitals reviewed.     Assessment & Plan:  1. Routine general medical examination at a health care facility - Reviewed labs in detail with this patient. All questions answered - Educated on the importance of diet and exercise.  - Follow up in one month  2. TOBACCO ABUSE-  - We reviewed options to help him quit smoking. We decided on Chantix. He is going to pick a quit date in the next two weeks. Follow up in one month  - varenicline (CHANTIX CONTINUING MONTH PAK) 1 MG tablet; Take 1 tablet (1 mg total) by mouth 2 (two) times daily.  Dispense: 60 tablet; Refill: 0 - varenicline (CHANTIX STARTING MONTH PAK) 0.5 MG X 11 & 1 MG X 42 tablet;  Take one 0.5 mg tablet by mouth once daily for 3 days, then increase to one 0.5 mg tablet twice daily for 4 days, then increase to one 1 mg tablet twice daily.  Dispense: 53 tablet; Refill: 0  Trial of chantix. Common side effects including rare risk of suicide ideation was discussed with the patient today.  Patient is instructed to go directly to the ED if this occurs.  We discussed that patient can continue to smoke for 1 week after starting chantix, but then must discontinue cigarettes.  He is also instructed to contact us prior to completion of the starter month pack for an rx for the continuation month pack.  5 minutes spent with patient today on tobacco cessation counseling.    3. Atherosclerosis of coronary artery  of native heart without angina pectoris, unspecified vessel or lesion type - 10 year risk of 11.6 %. It is recommended he start a statin. He does not want to at this time.   Shirline Frees, NP

## 2016-10-13 NOTE — Patient Instructions (Signed)
It was great seeing you today!  As discussed, your blood pressure and cholesterol levels are a little high. Quitting smoking will help both of these  Please start Chantix and follow up with me in one month   Health Maintenance, Male A healthy lifestyle and preventative care can promote health and wellness.  Maintain regular health, dental, and eye exams.  Eat a healthy diet. Foods like vegetables, fruits, whole grains, low-fat dairy products, and lean protein foods contain the nutrients you need and are low in calories. Decrease your intake of foods high in solid fats, added sugars, and salt. Get information about a proper diet from your health care provider, if necessary.  Regular physical exercise is one of the most important things you can do for your health. Most adults should get at least 150 minutes of moderate-intensity exercise (any activity that increases your heart rate and causes you to sweat) each week. In addition, most adults need muscle-strengthening exercises on 2 or more days a week.   Maintain a healthy weight. The body mass index (BMI) is a screening tool to identify possible weight problems. It provides an estimate of body fat based on height and weight. Your health care provider can find your BMI and can help you achieve or maintain a healthy weight. For males 20 years and older:  A BMI below 18.5 is considered underweight.  A BMI of 18.5 to 24.9 is normal.  A BMI of 25 to 29.9 is considered overweight.  A BMI of 30 and above is considered obese.  Maintain normal blood lipids and cholesterol by exercising and minimizing your intake of saturated fat. Eat a balanced diet with plenty of fruits and vegetables. Blood tests for lipids and cholesterol should begin at age 57 and be repeated every 5 years. If your lipid or cholesterol levels are high, you are over age 57, or you are at high risk for heart disease, you may need your cholesterol levels checked more  frequently.Ongoing high lipid and cholesterol levels should be treated with medicines if diet and exercise are not working.  If you smoke, find out from your health care provider how to quit. If you do not use tobacco, do not start.  Lung cancer screening is recommended for adults aged 55-80 years who are at high risk for developing lung cancer because of a history of smoking. A yearly low-dose CT scan of the lungs is recommended for people who have at least a 30-pack-year history of smoking and are current smokers or have quit within the past 15 years. A pack year of smoking is smoking an average of 1 pack of cigarettes a day for 1 year (for example, a 30-pack-year history of smoking could mean smoking 1 pack a day for 30 years or 2 packs a day for 15 years). Yearly screening should continue until the smoker has stopped smoking for at least 15 years. Yearly screening should be stopped for people who develop a health problem that would prevent them from having lung cancer treatment.  If you choose to drink alcohol, do not have more than 2 drinks per day. One drink is considered to be 12 oz (360 mL) of beer, 5 oz (150 mL) of wine, or 1.5 oz (45 mL) of liquor.  Avoid the use of street drugs. Do not share needles with anyone. Ask for help if you need support or instructions about stopping the use of drugs.  High blood pressure causes heart disease and increases the risk of  stroke. High blood pressure is more likely to develop in:  People who have blood pressure in the end of the normal range (100-139/85-89 mm Hg).  People who are overweight or obese.  People who are African American.  If you are 28-19 years of age, have your blood pressure checked every 3-5 years. If you are 37 years of age or older, have your blood pressure checked every year. You should have your blood pressure measured twice--once when you are at a hospital or clinic, and once when you are not at a hospital or clinic. Record the  average of the two measurements. To check your blood pressure when you are not at a hospital or clinic, you can use:  An automated blood pressure machine at a pharmacy.  A home blood pressure monitor.  If you are 66-53 years old, ask your health care provider if you should take aspirin to prevent heart disease.  Diabetes screening involves taking a blood sample to check your fasting blood sugar level. This should be done once every 3 years after age 44 if you are at a normal weight and without risk factors for diabetes. Testing should be considered at a younger age or be carried out more frequently if you are overweight and have at least 1 risk factor for diabetes.  Colorectal cancer can be detected and often prevented. Most routine colorectal cancer screening begins at the age of 83 and continues through age 68. However, your health care provider may recommend screening at an earlier age if you have risk factors for colon cancer. On a yearly basis, your health care provider may provide home test kits to check for hidden blood in the stool. A small camera at the end of a tube may be used to directly examine the colon (sigmoidoscopy or colonoscopy) to detect the earliest forms of colorectal cancer. Talk to your health care provider about this at age 78 when routine screening begins. A direct exam of the colon should be repeated every 5-10 years through age 75, unless early forms of precancerous polyps or small growths are found.  People who are at an increased risk for hepatitis B should be screened for this virus. You are considered at high risk for hepatitis B if:  You were born in a country where hepatitis B occurs often. Talk with your health care provider about which countries are considered high risk.  Your parents were born in a high-risk country and you have not received a shot to protect against hepatitis B (hepatitis B vaccine).  You have HIV or AIDS.  You use needles to inject street  drugs.  You live with, or have sex with, someone who has hepatitis B.  You are a man who has sex with other men (MSM).  You get hemodialysis treatment.  You take certain medicines for conditions like cancer, organ transplantation, and autoimmune conditions.  Hepatitis C blood testing is recommended for all people born from [birth year] through 1967 and any individual with known risk factors for hepatitis C.  Healthy men should no longer receive prostate-specific antigen (PSA) blood tests as part of routine cancer screening. Talk to your health care provider about prostate cancer screening.  Testicular cancer screening is not recommended for adolescents or adult males who have no symptoms. Screening includes self-exam, a health care provider exam, and other screening tests. Consult with your health care provider about any symptoms you have or any concerns you have about testicular cancer.  Practice safe sex. Use  condoms and avoid high-risk sexual practices to reduce the spread of sexually transmitted infections (STIs).  You should be screened for STIs, including gonorrhea and chlamydia if:  You are sexually active and are younger than 24 years.  You are older than 24 years, and your health care provider tells you that you are at risk for this type of infection.  Your sexual activity has changed since you were last screened, and you are at an increased risk for chlamydia or gonorrhea. Ask your health care provider if you are at risk.  If you are at risk of being infected with HIV, it is recommended that you take a prescription medicine daily to prevent HIV infection. This is called pre-exposure prophylaxis (PrEP). You are considered at risk if:  You are a man who has sex with other men (MSM).  You are a heterosexual man who is sexually active with multiple partners.  You take drugs by injection.  You are sexually active with a partner who has HIV.  Talk with your health care provider about  whether you are at high risk of being infected with HIV. If you choose to begin PrEP, you should first be tested for HIV. You should then be tested every 3 months for as long as you are taking PrEP.  Use sunscreen. Apply sunscreen liberally and repeatedly throughout the day. You should seek shade when your shadow is shorter than you. Protect yourself by wearing long sleeves, pants, a wide-brimmed hat, and sunglasses year round whenever you are outdoors.  Tell your health care provider of new moles or changes in moles, especially if there is a change in shape or color. Also, tell your health care provider if a mole is larger than the size of a pencil eraser.  A one-time screening for abdominal aortic aneurysm (AAA) and surgical repair of large AAAs by ultrasound is recommended for men aged 65-75 years who are current or former smokers.  Stay current with your vaccines (immunizations).   This information is not intended to replace advice given to you by your health care provider. Make sure you discuss any questions you have with your health care provider.   Document Released: 04/07/2008 Document Revised: 10/31/2014 Document Reviewed: 03/07/2011 Elsevier Interactive Patient Education [employer]2016 Elsevier Inc.

## 2016-11-16 ENCOUNTER — Ambulatory Visit: Payer: Federal, State, Local not specified - PPO | Admitting: Adult Health

## 2016-11-18 ENCOUNTER — Other Ambulatory Visit: Payer: Self-pay | Admitting: Family Medicine

## 2016-12-05 ENCOUNTER — Encounter: Payer: Federal, State, Local not specified - PPO | Admitting: Family

## 2017-05-15 ENCOUNTER — Encounter: Payer: Self-pay | Admitting: Adult Health

## 2017-05-15 ENCOUNTER — Encounter: Payer: Self-pay | Admitting: Family Medicine

## 2017-05-15 ENCOUNTER — Ambulatory Visit (INDEPENDENT_AMBULATORY_CARE_PROVIDER_SITE_OTHER): Payer: Federal, State, Local not specified - PPO | Admitting: Adult Health

## 2017-05-15 VITALS — BP 156/86 | Temp 98.3°F | Wt 181.0 lb

## 2017-05-15 DIAGNOSIS — G8929 Other chronic pain: Secondary | ICD-10-CM

## 2017-05-15 DIAGNOSIS — M25511 Pain in right shoulder: Secondary | ICD-10-CM

## 2017-05-15 MED ORDER — METHYLPREDNISOLONE ACETATE 80 MG/ML IJ SUSP: 80.0000 mg | Freq: Once | INTRAMUSCULAR | Status: DC

## 2017-05-15 NOTE — Progress Notes (Signed)
Subjective:    Patient ID: Travis Rice Jr., male    DOB: Oct 02, 1961, 56 y.o.   MRN: [id number]  HPI   56 year old male who  has a past medical history of Abnormal glucose; Abnormal partial thromboplastin time (PTT); Arthritis; Lupus anticoagulant positive; and Palpitations.  He presents to the office today for right shoulder pain. Pain has been worse over the last 1.5 weeks. He feels as though he agitated it while doing pushes ups. He reports that the pain is bad enough that he is unable to to sleep at night. Pain is worse when raising arm over head. He has no decreased ROM   He has prescribed Mobic but has not been using it on a regular basis.   Review of Systems See HPI   Past Medical History:  Diagnosis Date  . Abnormal glucose   . Abnormal partial thromboplastin time (PTT)   . Arthritis    right shoulder pain   . Lupus anticoagulant positive    but negative for antiphosolipid antibody and anti B2 glycoprotein antibody  . Palpitations     Social History   Social History  . Marital status: Divorced    Spouse name: N/A  . Number of children: N/A  . Years of education: N/A   Occupational History  . Not on file.   Social History Main Topics  . Smoking status: Current Some Day Smoker    Packs/day: 0.25    Types: Cigarettes    Last attempt to quit: 05/24/2012  . Smokeless tobacco: Never Used     Comment: trying to quit  . Alcohol use 0.0 oz/week     Comment: occ  . Drug use: No  . Sexual activity: Not on file   Other Topics Concern  . Not on file   Social History Narrative  . No narrative on file    No past surgical history on file.  Family History  Problem Relation Age of Onset  . Hypertension Father   . Lung cancer Father   . Hypertension Mother     Allergies  Allergen Reactions  . Zegerid [Omeprazole-Sodium Bicarbonate] Anaphylaxis    Rash, lip and hand swelling, itching , throat swelling    Current Outpatient Prescriptions on File Prior to  Visit  Medication Sig Dispense Refill  . aspirin 81 MG tablet Take 81 mg by mouth daily.    . Fish Oil OIL by Does not apply route daily at 12 noon.    . Multiple Vitamins-Minerals (MULTIVITAMIN ADULT PO) Take 1 tablet by mouth daily.    . cyclobenzaprine (FLEXERIL) 10 MG tablet Take 1 tablet (10 mg total) by mouth 3 (three) times daily as needed for muscle spasms. (Patient not taking: Reported on 05/15/2017) 30 tablet 0  . meloxicam (MOBIC) 15 MG tablet TAKE 1 TABLET (15 MG TOTAL) BY MOUTH DAILY. (Patient not taking: Reported on 05/15/2017) 90 tablet 1   No current facility-administered medications on file prior to visit.     BP (!) 156/86 (BP Location: Left Arm)   Temp 98.3 F (36.8 C) (Oral)   Wt 181 lb (82.1 kg)   BMI 28.35 kg/m       Objective:   Physical Exam  Constitutional: He is oriented to person, place, and time. He appears well-developed and well-nourished.  Musculoskeletal: Normal range of motion. He exhibits no edema, tenderness or deformity.  Pain when lifting arm over head. No decreased grip strength. No tenderness with palpation   Neurological: He  is alert and oriented to person, place, and time.  Skin: Skin is warm and dry. No rash noted. He is not diaphoretic. No erythema.  Psychiatric: He has a normal mood and affect. His behavior is normal. Judgment and thought content normal.  Nursing note and vitals reviewed.     Assessment & Plan:  1. Chronic right shoulder pain Discussed risks and benefits of corticosteroid injection and patient consented.  After prepping skin with betadine, injected 80 mg depomedrol and 2 cc of plain xylocaine with 22 gauge one and one half inch needle using posterior approach and pt tolerated well. - methylPREDNISolone acetate (DEPO-MEDROL) injection 80 mg; Inject 1 mL (80 mg total) into the articular space once. - He can take mobic as needed  Shirline Frees, NP

## 2017-05-23 ENCOUNTER — Ambulatory Visit: Payer: Federal, State, Local not specified - PPO | Admitting: Adult Health

## 2017-07-14 ENCOUNTER — Encounter: Payer: Self-pay | Admitting: Adult Health

## 2017-10-04 ENCOUNTER — Encounter: Payer: Self-pay | Admitting: Family Medicine

## 2017-10-04 ENCOUNTER — Encounter: Payer: Self-pay | Admitting: Adult Health

## 2017-10-04 ENCOUNTER — Ambulatory Visit: Payer: Federal, State, Local not specified - PPO | Admitting: Adult Health

## 2017-10-04 VITALS — BP 120/76 | Temp 99.6°F | Wt 178.0 lb

## 2017-10-04 DIAGNOSIS — J0140 Acute pansinusitis, unspecified: Secondary | ICD-10-CM | POA: Diagnosis not present

## 2017-10-04 DIAGNOSIS — Z760 Encounter for issue of repeat prescription: Secondary | ICD-10-CM | POA: Diagnosis not present

## 2017-10-04 LAB — POCT INFLUENZA A/B
Influenza A, POC: NEGATIVE
Influenza B, POC: NEGATIVE

## 2017-10-04 MED ORDER — DOXYCYCLINE HYCLATE 100 MG PO CAPS: 100.0000 mg | ORAL_CAPSULE | Freq: Two times a day (BID) | ORAL | 0 refills | Status: DC

## 2017-10-04 MED ORDER — MELOXICAM 15 MG PO TABS: 15.0000 mg | ORAL_TABLET | Freq: Every day | ORAL | 1 refills | Status: DC

## 2017-10-04 NOTE — Progress Notes (Signed)
Subjective:    Patient ID: Travis Rice., male    DOB: 01-Nov-1960, 56 y.o.   MRN: [id number]  HPI  56 year old male who  has a past medical history of Abnormal glucose, Abnormal partial thromboplastin time (PTT), Arthritis, Lupus anticoagulant positive, and Palpitations. He presents to the office today with the acute complaints of generalized body aches, fatigue, chills, headache, nasal and chest congestion, and sinus pain and pressure.   He denies any n/v/d/fevers.   He has been using OTC cough and cold medication without relief    Review of Systems  Constitutional: Positive for activity change, chills and fatigue. Negative for fever.  HENT: Positive for congestion, postnasal drip, rhinorrhea, sinus pressure, sinus pain and sore throat. Negative for ear discharge and ear pain.   Respiratory: Positive for cough. Negative for shortness of breath and wheezing.   Cardiovascular: Negative.   Gastrointestinal: Negative.   Musculoskeletal: Positive for arthralgias and myalgias.  Neurological: Positive for headaches.   Past Medical History:  Diagnosis Date  . Abnormal glucose   . Abnormal partial thromboplastin time (PTT)   . Arthritis    right shoulder pain   . Lupus anticoagulant positive    but negative for antiphosolipid antibody and anti B2 glycoprotein antibody  . Palpitations     Social History   Socioeconomic History  . Marital status: Divorced    Spouse name: Not on file  . Number of children: Not on file  . Years of education: Not on file  . Highest education level: Not on file  Social Needs  . Financial resource strain: Not on file  . Food insecurity - worry: Not on file  . Food insecurity - inability: Not on file  . Transportation needs - medical: Not on file  . Transportation needs - non-medical: Not on file  Occupational History  . Not on file  Tobacco Use  . Smoking status: Current Some Day Smoker    Packs/day: 0.25    Types: Cigarettes    Last  attempt to quit: 05/24/2012    Years since quitting: 5.3  . Smokeless tobacco: Never Used  . Tobacco comment: trying to quit  Substance and Sexual Activity  . Alcohol use: Yes    Alcohol/week: 0.0 oz    Comment: occ  . Drug use: No  . Sexual activity: Not on file  Other Topics Concern  . Not on file  Social History Narrative  . Not on file    History reviewed. No pertinent surgical history.  Family History  Problem Relation Age of Onset  . Hypertension Father   . Lung cancer Father   . Hypertension Mother     Allergies  Allergen Reactions  . Zegerid [Omeprazole-Sodium Bicarbonate] Anaphylaxis    Rash, lip and hand swelling, itching , throat swelling    Current Outpatient Medications on File Prior to Visit  Medication Sig Dispense Refill  . aspirin 81 MG tablet Take 81 mg by mouth daily.    . Multiple Vitamins-Minerals (MULTIVITAMIN ADULT PO) Take 1 tablet by mouth daily.    . Fish Oil OIL by Does not apply route daily at 12 noon.    . meloxicam (MOBIC) 15 MG tablet TAKE 1 TABLET (15 MG TOTAL) BY MOUTH DAILY. (Patient not taking: Reported on 05/15/2017) 90 tablet 1   Current Facility-Administered Medications on File Prior to Visit  Medication Dose Route Frequency Provider Last Rate Last Dose  . methylPREDNISolone acetate (DEPO-MEDROL) injection 80 mg  80  mg Intra-articular Once Havoc Sanluis, NP        BP 120/76 (BP Location: Right Arm)   Temp 99.6 F (37.6 C) (Oral)   Wt 178 lb (80.7 kg)   BMI 27.88 kg/m       Objective:   Physical Exam  Constitutional: He is oriented to person, place, and time. Vital signs are normal. He appears well-developed and well-nourished. He appears ill. No distress.  HENT:  Right Ear: Hearing, tympanic membrane, external ear and ear canal normal.  Left Ear: Hearing, tympanic membrane, external ear and ear canal normal.  Nose: Mucosal edema and rhinorrhea present. Right sinus exhibits maxillary sinus tenderness and frontal sinus  tenderness. Left sinus exhibits maxillary sinus tenderness and frontal sinus tenderness.  Mouth/Throat: Uvula is midline and mucous membranes are normal. Oropharyngeal exudate present.  Cardiovascular: Normal rate, regular rhythm, normal heart sounds and intact distal pulses. Exam reveals no gallop and no friction rub.  No murmur heard. Pulmonary/Chest: Effort normal and breath sounds normal. No respiratory distress. He has no wheezes. He has no rales. He exhibits no tenderness.  Neurological: He is alert and oriented to person, place, and time.  Skin: Skin is warm and dry. No rash noted. He is not diaphoretic. No erythema. No pallor.  Psychiatric: He has a normal mood and affect. His behavior is normal. Judgment and thought content normal.  Nursing note and vitals reviewed.     Assessment & Plan:  1. Acute non-recurrent pansinusitis  - POC Influenza A/B- negative.  - Will treat for sinusitis  - doxycycline (VIBRAMYCIN) 100 MG capsule; Take 1 capsule (100 mg total) by mouth 2 (two) times daily.  Dispense: 14 capsule; Refill: 0 - Follow up in 2-3 days if no improvement  - Stay hydrated and rest   2. Medication refill  - meloxicam (MOBIC) 15 MG tablet; Take 1 tablet (15 mg total) by mouth daily.  Dispense: 90 tablet; Refill: 1  Shirline Frees, NP

## 2017-10-21 ENCOUNTER — Ambulatory Visit: Payer: Federal, State, Local not specified - PPO | Admitting: Emergency Medicine

## 2017-10-21 ENCOUNTER — Other Ambulatory Visit: Payer: Self-pay

## 2017-10-21 ENCOUNTER — Encounter: Payer: Self-pay | Admitting: Emergency Medicine

## 2017-10-21 VITALS — BP 122/80 | HR 116 | Temp 98.9°F | Resp 18 | Ht 67.0 in | Wt 170.2 lb

## 2017-10-21 DIAGNOSIS — L299 Pruritus, unspecified: Secondary | ICD-10-CM | POA: Diagnosis not present

## 2017-10-21 DIAGNOSIS — L509 Urticaria, unspecified: Secondary | ICD-10-CM

## 2017-10-21 MED ORDER — PREDNISONE 20 MG PO TABS: 40.0000 mg | ORAL_TABLET | Freq: Every day | ORAL | 0 refills | Status: AC

## 2017-10-21 MED ORDER — DIPHENHYDRAMINE HCL 50 MG/ML IJ SOLN
50.0000 mg | Freq: Once | INTRAMUSCULAR | Status: AC
  Administered 2017-10-21: 50 mg via INTRAMUSCULAR

## 2017-10-21 MED ORDER — FAMOTIDINE 40 MG PO TABS: 40.0000 mg | ORAL_TABLET | Freq: Every day | ORAL | 0 refills | Status: DC

## 2017-10-21 MED ORDER — METHYLPREDNISOLONE SODIUM SUCC 125 MG IJ SOLR
125.0000 mg | Freq: Once | INTRAMUSCULAR | Status: AC
  Administered 2017-10-21: 125 mg via INTRAMUSCULAR

## 2017-10-21 NOTE — Patient Instructions (Addendum)
     IF you received an x-ray today, you will receive an invoice from [facility]. Please contact [facility] at [phone] with questions or concerns regarding your invoice.   IF you received labwork today, you will receive an invoice from [facility]. Please contact [facility] at [phone] with questions or concerns regarding your invoice.   Our billing staff will not be able to assist you with questions regarding bills from these companies.  You will be contacted with the lab results as soon as they are available. The fastest way to get your results is to activate your My Chart account. Instructions are located on the last page of this paperwork. If you have not heard from us regarding the results in 2 weeks, please contact this office.     Hives Hives (urticaria) are itchy, red, swollen areas on your skin. Hives can show up on any part of your body, and they can vary in size. They can be as small as the tip of a pen or much larger. Hives often fade within 24 hours (acute hives). In other cases, new hives show up after old ones fade. This can continue for many days or weeks (chronic hives). Hives are caused by your body's reaction to an irritant or to something that you are allergic to (trigger). You can get hives right after being around a trigger or hours later. Hives do not spread from person to person (are not contagious). Hives may get worse if you scratch them, if you exercise, or if you have worries (emotional stress). Follow these instructions at home: Medicines  Take or apply over-the-counter and prescription medicines only as told by your doctor.  If you were prescribed an antibiotic medicine, use it as told by your doctor. Do not stop taking the antibiotic even if you start to feel better. Skin Care  Apply cool, wet cloths (cool compresses) to the itchy, red, swollen areas.  Do not scratch your skin. Do not rub your skin. General instructions  Do not take  hot showers or baths. This can make itching worse.  Do not wear tight clothes.  Use sunscreen and wear clothing that covers your skin when you are outside.  Avoid any triggers that cause your hives. Keep a journal to help you keep track of what causes your hives. Write down: ? What medicines you take. ? What you eat and drink. ? What products you use on your skin.  Keep all follow-up visits as told by your doctor. This is important. Contact a doctor if:  Your symptoms are not better with medicine.  Your joints are painful or swollen. Get help right away if:  You have a fever.  You have belly pain.  Your tongue or lips are swollen.  Your eyelids are swollen.  Your chest or throat feels tight.  You have trouble breathing or swallowing. These symptoms may be an emergency. Do not wait to see if the symptoms will go away. Get medical help right away. Call your local emergency services (911 in the [place] Do not drive yourself to the hospital. This information is not intended to replace advice given to you by your health care provider. Make sure you discuss any questions you have with your health care provider. Document Released: 07/19/2008 Document Revised: 03/17/2016 Document Reviewed: 07/29/2015 Elsevier Interactive Patient Education  2018 [employer] Inc.  

## 2017-10-21 NOTE — Progress Notes (Signed)
Travis FramesJohn L Vanbeek Jr. 56 y.o.   Chief Complaint  Patient presents with  . swollen hands    both hands are swollen and has blisters started last night   . Foot Injury    having same blisters on left foot and has knot on back of head     HISTORY OF PRESENT ILLNESS: This is a 56 y.o. male complaining of diffuse itchy painful swollen rash since am today; woke up with it; denies SOB or trouble swallowing.  Urticaria  This is a new problem. The current episode started today. The problem has been gradually worsening since onset. The rash is diffuse. The rash is characterized by itchiness, redness, swelling and pain. It is unknown if there was an exposure to a precipitant. Pertinent negatives include no congestion, cough, diarrhea, eye pain, facial edema, fatigue, fever, rhinorrhea, shortness of breath, sore throat or vomiting. Past treatments include antihistamine (Benadryl 50 mg 7 am). The treatment provided no relief. There is no history of allergies or asthma.     Prior to Admission medications   Medication Sig Start Date End Date Taking? Authorizing Provider  aspirin 81 MG tablet Take 81 mg by mouth daily.    [provider]  doxycycline (VIBRAMYCIN) 100 MG capsule Take 1 capsule (100 mg total) by mouth 2 (two) times daily. Patient not taking: Reported on 10/21/2017 10/04/17   Shirline FreesNafziger, Cory, NP  Fish Oil OIL by Does not apply route daily at 12 noon.    [provider]  meloxicam (MOBIC) 15 MG tablet Take 1 tablet (15 mg total) by mouth daily. Patient not taking: Reported on 10/21/2017 10/04/17   Shirline FreesNafziger, Cory, NP  Multiple Vitamins-Minerals (MULTIVITAMIN ADULT PO) Take 1 tablet by mouth daily.    [provider]    Allergies  Allergen Reactions  . Zegerid [Omeprazole-Sodium Bicarbonate] Anaphylaxis    Rash, lip and hand swelling, itching , throat swelling    Patient Active Problem List   Diagnosis Date Noted  . Right shoulder pain 07/21/2015  .  Hyponatremia 03/06/2015  . Renal insufficiency, mild 03/06/2015  . Left knee pain 01/11/2013  . Preventative health care 12/07/2011  . HYPERGLYCEMIA 06/18/2009  . PRIMARY HYPERCOAGULABLE STATE 07/09/2008  . TOBACCO ABUSE 07/09/2008  . Coronary atherosclerosis 07/09/2008    Past Medical History:  Diagnosis Date  . Abnormal glucose   . Abnormal partial thromboplastin time (PTT)   . Arthritis    right shoulder pain   . Lupus anticoagulant positive    but negative for antiphosolipid antibody and anti B2 glycoprotein antibody  . Palpitations     No past surgical history on file.  Social History   Socioeconomic History  . Marital status: Divorced    Spouse name: Not on file  . Number of children: Not on file  . Years of education: Not on file  . Highest education level: Not on file  Social Needs  . Financial resource strain: Not on file  . Food insecurity - worry: Not on file  . Food insecurity - inability: Not on file  . Transportation needs - medical: Not on file  . Transportation needs - non-medical: Not on file  Occupational History  . Not on file  Tobacco Use  . Smoking status: Current Some Day Smoker    Packs/day: 0.25    Types: Cigarettes    Last attempt to quit: 05/24/2012    Years since quitting: 5.4  . Smokeless tobacco: Never Used  . Tobacco comment: trying to  quit  Substance and Sexual Activity  . Alcohol use: Yes    Alcohol/week: 0.0 oz    Comment: occ  . Drug use: No  . Sexual activity: Not on file  Other Topics Concern  . Not on file  Social History Narrative  . Not on file    Family History  Problem Relation Age of Onset  . Hypertension Father   . Lung cancer Father   . Hypertension Mother      Review of Systems  Constitutional: Negative.  Negative for chills, fatigue and fever.  HENT: Negative.  Negative for congestion, rhinorrhea and sore throat.   Eyes: Negative.  Negative for pain.  Respiratory: Negative.  Negative for cough,  shortness of breath and wheezing.   Cardiovascular: Negative.  Negative for chest pain and palpitations.  Gastrointestinal: Negative.  Negative for abdominal pain, diarrhea, nausea and vomiting.  Genitourinary: Negative.  Negative for dysuria and hematuria.  Musculoskeletal: Negative.  Negative for myalgias and neck pain.  Skin: Positive for itching and rash.  Neurological: Negative.  Negative for dizziness and headaches.  Endo/Heme/Allergies: Negative.     Vitals:   10/21/17 1317  BP: 122/80  Pulse: (!) 116  Resp: 18  Temp: 98.9 F (37.2 C)  SpO2: 95%    Physical Exam  Constitutional: He is oriented to person, place, and time. He appears well-developed and well-nourished.  HENT:  Head: Normocephalic and atraumatic.  Right Ear: External ear normal.  Left Ear: External ear normal.  Nose: Nose normal.  Mouth/Throat: Oropharynx is clear and moist.  Eyes: Conjunctivae and EOM are normal. Pupils are equal, round, and reactive to light.  Neck: Normal range of motion. Neck supple. No JVD present.  Cardiovascular: Normal rate, regular rhythm, normal heart sounds and intact distal pulses.  Pulmonary/Chest: Effort normal and breath sounds normal. No stridor. No respiratory distress. He has no wheezes.  Abdominal: Soft. Bowel sounds are normal. He exhibits no distension. There is no tenderness.  Musculoskeletal: Normal range of motion.  Lymphadenopathy:    He has no cervical adenopathy.  Neurological: He is alert and oriented to person, place, and time. No sensory deficit. He exhibits normal muscle tone. Coordination normal.  Skin: Capillary refill takes less than 2 seconds. Rash noted.  Diffuse hives to extremities, trunk, and scalp.  Psychiatric: He has a normal mood and affect. His behavior is normal.  Vitals reviewed.    ASSESSMENT & PLAN: Travis Rice was seen today for swollen hands and foot injury.  Diagnoses and all orders for this visit:  Full body hives -      methylPREDNISolone sodium succinate (SOLU-MEDROL) 125 mg/2 mL injection 125 mg -     diphenhydrAMINE (BENADRYL) injection 50 mg -     predniSONE (DELTASONE) 20 MG tablet; Take 2 tablets (40 mg total) by mouth daily with breakfast for 5 days. -     famotidine (PEPCID) 40 MG tablet; Take 1 tablet (40 mg total) by mouth daily for 7 days.  Itching    Patient Instructions       IF you received an x-ray today, you will receive an invoice from [facility]. Please contact [facility] at [phone] with questions or concerns regarding your invoice.   IF you received labwork today, you will receive an invoice from [facility]. Please contact [facility] at [phone] with questions or concerns regarding your invoice.   Our billing staff will not be able to assist you with questions regarding bills from these companies.  You will be  contacted with the lab results as soon as they are available. The fastest way to get your results is to activate your My Chart account. Instructions are located on the last page of this paperwork. If you have not heard from [place] regarding the results in 2 weeks, please contact this office.     Hives Hives (urticaria) are itchy, red, swollen areas on your skin. Hives can show up on any part of your body, and they can vary in size. They can be as small as the tip of a pen or much larger. Hives often fade within 24 hours (acute hives). In other cases, new hives show up after old ones fade. This can continue for many days or weeks (chronic hives). Hives are caused by your body's reaction to an irritant or to something that you are allergic to (trigger). You can get hives right after being around a trigger or hours later. Hives do not spread from person to person (are not contagious). Hives may get worse if you scratch them, if you exercise, or if you have worries (emotional stress). Follow these instructions at home: Medicines  Take or apply over-the-counter  and prescription medicines only as told by your doctor.  If you were prescribed an antibiotic medicine, use it as told by your doctor. Do not stop taking the antibiotic even if you start to feel better. Skin Care  Apply cool, wet cloths (cool compresses) to the itchy, red, swollen areas.  Do not scratch your skin. Do not rub your skin. General instructions  Do not take hot showers or baths. This can make itching worse.  Do not wear tight clothes.  Use sunscreen and wear clothing that covers your skin when you are outside.  Avoid any triggers that cause your hives. Keep a journal to help you keep track of what causes your hives. Write down: ? What medicines you take. ? What you eat and drink. ? What products you use on your skin.  Keep all follow-up visits as told by your doctor. This is important. Contact a doctor if:  Your symptoms are not better with medicine.  Your joints are painful or swollen. Get help right away if:  You have a fever.  You have belly pain.  Your tongue or lips are swollen.  Your eyelids are swollen.  Your chest or throat feels tight.  You have trouble breathing or swallowing. These symptoms may be an emergency. Do not wait to see if the symptoms will go away. Get medical help right away. Call your local emergency services (911 in the [place] Do not drive yourself to the hospital. This information is not intended to replace advice given to you by your health care provider. Make sure you discuss any questions you have with your health care provider. Document Released: 07/19/2008 Document Revised: 03/17/2016 Document Reviewed: 07/29/2015 Elsevier Interactive Patient Education  2018 Elsevier Inc.      Edwina Barth, MD [facility]

## 2017-10-23 ENCOUNTER — Telehealth: Payer: Self-pay | Admitting: *Deleted

## 2017-10-23 NOTE — Telephone Encounter (Signed)
Patient seen same day at [facility].

## 2017-10-23 NOTE — Telephone Encounter (Signed)
Patient Name: Travis Rice Gender: Male DOB: 1961-01-30 Age: [age] Y 10 M 18 D Return Phone Number: [phone] (Primary), [phone] (Secondary) Address: [place]: [place] [id number] Client [facility] Night - Client Client Site [facility] - Night Physician Shirline FreesNafziger, Cory - NP Contact Type Call Who Is Calling Patient / Member / Family / Caregiver Call Type Triage / Clinical Relationship To Patient Self Return Phone Number [phone] (Primary) Chief Complaint Allergic reaction (unknown symptoms) Reason for Call Symptomatic / Request for Health Information Initial Comment Caller states that his hand are swollen, itching, burning, knot in the back of his head, and bruise on his leg that hurts and may be having a allergic reaction to something. Started yesterday. Additional Comment [place] location transferred pt to be triaged before making an appointment Translation No Nurse Assessment Nurse: Mayford KnifeWilliams, RN, [place] Date/Time (Eastern Time): 10/21/2017 9:16:18 AM Confirm and document reason for call. If symptomatic, describe symptoms. ---Caller states that his hand are swollen, itching, burning, knot in the back of his head, and bruise on his leg that hurts and may be having a allergic reaction to something. Symptoms started yesterday. Does the patient have any new or worsening symptoms? ---Yes Will a triage be completed? ---Yes Related visit to physician within the last 2 weeks? ---Yes Does the PT have any chronic conditions? (i.e. diabetes, asthma, etc.) ---No Is this a behavioral health or substance abuse call? ---No Guidelines Guideline Title Affirmed Question Affirmed Notes Nurse Date/Time (Eastern Time) Rash - Widespread On Drugs [1] Purple or bloodcolored rash (spots or dots) AND [2] no fever AND [3] sounds well to triager Mayford KnifeWilliams, RN, Whitney 10/21/2017 9:18:57 AM Disp. Time Lamount Cohen(Eastern Time) Disposition Final  User 10/21/2017 9:28:40 AM See Physician within 4 Hours (or PCP triage) Yes Mayford KnifeWilliams, RN, Alphonzo LemmingsWhitney

## 2017-10-27 ENCOUNTER — Ambulatory Visit: Payer: Federal, State, Local not specified - PPO | Admitting: Emergency Medicine

## 2017-10-27 ENCOUNTER — Other Ambulatory Visit: Payer: Self-pay

## 2017-10-27 ENCOUNTER — Encounter: Payer: Self-pay | Admitting: Emergency Medicine

## 2017-10-27 VITALS — BP 138/78 | HR 108 | Temp 98.6°F | Resp 16 | Ht 67.25 in | Wt 173.8 lb

## 2017-10-27 DIAGNOSIS — L299 Pruritus, unspecified: Secondary | ICD-10-CM | POA: Diagnosis not present

## 2017-10-27 DIAGNOSIS — L509 Urticaria, unspecified: Secondary | ICD-10-CM

## 2017-10-27 DIAGNOSIS — R197 Diarrhea, unspecified: Secondary | ICD-10-CM

## 2017-10-27 MED ORDER — PREDNISONE 20 MG PO TABS: 40.0000 mg | ORAL_TABLET | Freq: Every day | ORAL | 0 refills | Status: AC

## 2017-10-27 MED ORDER — METHYLPREDNISOLONE SODIUM SUCC 125 MG IJ SOLR
125.0000 mg | Freq: Once | INTRAMUSCULAR | Status: AC
  Administered 2017-10-27: 125 mg via INTRAMUSCULAR

## 2017-10-27 MED ORDER — HYDROXYZINE PAMOATE 25 MG PO CAPS: 25.0000 mg | ORAL_CAPSULE | Freq: Three times a day (TID) | ORAL | 0 refills | Status: DC | PRN

## 2017-10-27 NOTE — Progress Notes (Signed)
Travis Rice. 57 y.o.   Chief Complaint  Patient presents with  . Gastroesophageal Reflux    per patient flare-up again since last night    HISTORY OF PRESENT ILLNESS: This is a 57 y.o. male seen by me 12/29 for hives; got better; last night had dinner at [employer] and 2-3 hours developed abdominal discomfort with watery non-bloody diarrhea; now feels like hives are coming back. Abdominal discomfort and diarrhea better now.  HPI   Prior to Admission medications   Medication Sig Start Date End Date Taking? Authorizing Provider  aspirin 81 MG tablet Take 81 mg by mouth daily.   Yes [provider]  famotidine (PEPCID) 40 MG tablet Take 1 tablet (40 mg total) by mouth daily for 7 days. 10/21/17 10/28/17 Yes Takasha Vetere, Eilleen Kempf, MD  Fish Oil OIL by Does not apply route daily at 12 noon.   Yes [provider]  meloxicam (MOBIC) 15 MG tablet Take 1 tablet (15 mg total) by mouth daily. 10/04/17  Yes Nafziger, Kandee Keen, NP  Multiple Vitamins-Minerals (MULTIVITAMIN ADULT PO) Take 1 tablet by mouth daily.   Yes [provider]  doxycycline (VIBRAMYCIN) 100 MG capsule Take 1 capsule (100 mg total) by mouth 2 (two) times daily. Patient not taking: Reported on 10/21/2017 10/04/17   Shirline Frees, NP    Allergies  Allergen Reactions  . Zegerid [Omeprazole-Sodium Bicarbonate] Anaphylaxis    Rash, lip and hand swelling, itching , throat swelling    Patient Active Problem List   Diagnosis Date Noted  . Right shoulder pain 07/21/2015  . Hyponatremia 03/06/2015  . Renal insufficiency, mild 03/06/2015  . Left knee pain 01/11/2013  . Preventative health care 12/07/2011  . HYPERGLYCEMIA 06/18/2009  . PRIMARY HYPERCOAGULABLE STATE 07/09/2008  . TOBACCO ABUSE 07/09/2008  . Coronary atherosclerosis 07/09/2008    Past Medical History:  Diagnosis Date  . Abnormal glucose   . Abnormal partial thromboplastin time (PTT)   . Arthritis    right shoulder pain   .  Lupus anticoagulant positive    but negative for antiphosolipid antibody and anti B2 glycoprotein antibody  . Palpitations     No past surgical history on file.  Social History   Socioeconomic History  . Marital status: Divorced    Spouse name: Not on file  . Number of children: Not on file  . Years of education: Not on file  . Highest education level: Not on file  Social Needs  . Financial resource strain: Not on file  . Food insecurity - worry: Not on file  . Food insecurity - inability: Not on file  . Transportation needs - medical: Not on file  . Transportation needs - non-medical: Not on file  Occupational History  . Not on file  Tobacco Use  . Smoking status: Current Some Day Smoker    Packs/day: 0.25    Types: Cigarettes    Last attempt to quit: 05/24/2012    Years since quitting: 5.4  . Smokeless tobacco: Never Used  . Tobacco comment: trying to quit  Substance and Sexual Activity  . Alcohol use: Yes    Alcohol/week: 0.0 oz    Comment: occ  . Drug use: No  . Sexual activity: Not on file  Other Topics Concern  . Not on file  Social History Narrative  . Not on file    Family History  Problem Relation Age of Onset  . Hypertension Father   . Lung cancer Father   .  Hypertension Mother      Review of Systems  Constitutional: Negative.  Negative for chills and fever.  HENT: Negative.   Eyes: Negative.   Respiratory: Negative.  Negative for cough and shortness of breath.   Cardiovascular: Negative.  Negative for chest pain.  Gastrointestinal: Positive for abdominal pain, diarrhea and nausea. Negative for blood in stool and vomiting.  Genitourinary: Negative.  Negative for dysuria and hematuria.  Musculoskeletal: Negative.  Negative for back pain, myalgias and neck pain.  Skin: Positive for itching and rash.  Neurological: Negative.  Negative for dizziness and headaches.  Endo/Heme/Allergies: Negative.   All other systems reviewed and are  negative.  Vitals:   10/27/17 0840  BP: 138/78  Pulse: (!) 108  Resp: 16  Temp: 98.6 F (37 C)  SpO2: 98%     Physical Exam  Constitutional: He is oriented to person, place, and time. He appears well-developed and well-nourished.  HENT:  Head: Normocephalic and atraumatic.  Nose: Nose normal.  Mouth/Throat: Oropharynx is clear and moist.  Eyes: Conjunctivae and EOM are normal. Pupils are equal, round, and reactive to light.  Neck: Normal range of motion. Neck supple. No JVD present. No thyromegaly present.  Cardiovascular: Normal rate, regular rhythm and normal heart sounds.  Pulmonary/Chest: Effort normal and breath sounds normal.  Abdominal: Soft. Bowel sounds are normal. He exhibits no distension.  Musculoskeletal: Normal range of motion.  Lymphadenopathy:    He has no cervical adenopathy.  Neurological: He is alert and oriented to person, place, and time. No sensory deficit. He exhibits normal muscle tone.  Skin: Skin is warm and dry.  Mild residual swelling and erythema of palms of hands; body hives gone.  Psychiatric: He has a normal mood and affect. His behavior is normal.  Vitals reviewed.  A total of 25 minutes was spent in the room with the patient, greater than 50% of which was in counseling/coordination of care regarding present condition..   ASSESSMENT & PLAN: Travis RuizJohn was seen today for gastroesophageal reflux.  Diagnoses and all orders for this visit:  Itching -     methylPREDNISolone sodium succinate (SOLU-MEDROL) 125 mg/2 mL injection 125 mg -     hydrOXYzine (VISTARIL) 25 MG capsule; Take 1 capsule (25 mg total) by mouth 3 (three) times daily as needed.  Hives -     methylPREDNISolone sodium succinate (SOLU-MEDROL) 125 mg/2 mL injection 125 mg -     predniSONE (DELTASONE) 20 MG tablet; Take 2 tablets (40 mg total) by mouth daily with breakfast for 5 days.  Diarrhea of presumed infectious origin    Patient Instructions       IF you received an  x-ray today, you will receive an invoice from [facility]. Please contact [facility] at [phone] with questions or concerns regarding your invoice.   IF you received labwork today, you will receive an invoice from [facility]. Please contact [facility] at [phone] with questions or concerns regarding your invoice.   Our billing staff will not be able to assist you with questions regarding bills from these companies.  You will be contacted with the lab results as soon as they are available. The fastest way to get your results is to activate your My Chart account. Instructions are located on the last page of this paperwork. If you have not heard from us regarding the results in 2 weeks, please contact this office.     Diarrhea, Adult Diarrhea is when you have loose and water poop (stool) often. Diarrhea  can make you feel weak and cause you to get dehydrated. Dehydration can make you tired and thirsty, make you have a dry mouth, and make it so you pee (urinate) less often. Diarrhea often lasts 2-3 days. However, it can last longer if it is a sign of something more serious. It is important to treat your diarrhea as told by your doctor. Follow these instructions at home: Eating and drinking  Follow these recommendations as told by your doctor:  Take an oral rehydration solution (ORS). This is a drink that is sold at pharmacies and stores.  Drink clear fluids, such as: ? Water. ? Ice chips. ? Diluted fruit juice. ? Low-calorie sports drinks.  Eat bland, easy-to-digest foods in small amounts as you are able. These foods include: ? Bananas. ? Applesauce. ? Rice. ? Low-fat (lean) meats. ? Toast. ? Crackers.  Avoid drinking fluids that have a lot of sugar or caffeine in them.  Avoid alcohol.  Avoid spicy or fatty foods.  General instructions   Drink enough fluid to keep your pee (urine) clear or pale yellow.  Wash your hands often. If you cannot use soap and  water, use hand sanitizer.  Make sure that all people in your home wash their hands well and often.  Take over-the-counter and prescription medicines only as told by your doctor.  Rest at home while you get better.  Watch your condition for any changes.  Take a warm bath to help with any burning or pain from having diarrhea.  Keep all follow-up visits as told by your doctor. This is important. Contact a doctor if:  You have a fever.  Your diarrhea gets worse.  You have new symptoms.  You cannot keep fluids down.  You feel light-headed or dizzy.  You have a headache.  You have muscle cramps. Get help right away if:  You have chest pain.  You feel very weak or you pass out (faint).  You have bloody or black poop or poop that look like tar.  You have very bad pain, cramping, or bloating in your belly (abdomen).  You have trouble breathing or you are breathing very quickly.  Your heart is beating very quickly.  Your skin feels cold and clammy.  You feel confused.  You have signs of dehydration, such as: ? Dark pee, hardly any pee, or no pee. ? Cracked lips. ? Dry mouth. ? Sunken eyes. ? Sleepiness. ? Weakness. This information is not intended to replace advice given to you by your health care provider. Make sure you discuss any questions you have with your health care provider. Document Released: 03/28/2008 Document Revised: 04/29/2016 Document Reviewed: 06/16/2015 Elsevier Interactive Patient Education  2018 Elsevier Inc.      Edwina Barth, MD [facility]

## 2017-10-27 NOTE — Patient Instructions (Addendum)
   IF you received an x-ray today, you will receive an invoice from [facility]. Please contact [facility] at [phone] with questions or concerns regarding your invoice.   IF you received labwork today, you will receive an invoice from [facility]. Please contact [facility] at [phone] with questions or concerns regarding your invoice.   Our billing staff will not be able to assist you with questions regarding bills from these companies.  You will be contacted with the lab results as soon as they are available. The fastest way to get your results is to activate your My Chart account. Instructions are located on the last page of this paperwork. If you have not heard from us regarding the results in 2 weeks, please contact this office.     Diarrhea, Adult Diarrhea is when you have loose and water poop (stool) often. Diarrhea can make you feel weak and cause you to get dehydrated. Dehydration can make you tired and thirsty, make you have a dry mouth, and make it so you pee (urinate) less often. Diarrhea often lasts 2-3 days. However, it can last longer if it is a sign of something more serious. It is important to treat your diarrhea as told by your doctor. Follow these instructions at home: Eating and drinking  Follow these recommendations as told by your doctor:  Take an oral rehydration solution (ORS). This is a drink that is sold at pharmacies and stores.  Drink clear fluids, such as: ? Water. ? Ice chips. ? Diluted fruit juice. ? Low-calorie sports drinks.  Eat bland, easy-to-digest foods in small amounts as you are able. These foods include: ? Bananas. ? Applesauce. ? Rice. ? Low-fat (lean) meats. ? Toast. ? Crackers.  Avoid drinking fluids that have a lot of sugar or caffeine in them.  Avoid alcohol.  Avoid spicy or fatty foods.  General instructions   Drink enough fluid to keep your pee (urine) clear or pale yellow.  Wash your hands often. If  you cannot use soap and water, use hand sanitizer.  Make sure that all people in your home wash their hands well and often.  Take over-the-counter and prescription medicines only as told by your doctor.  Rest at home while you get better.  Watch your condition for any changes.  Take a warm bath to help with any burning or pain from having diarrhea.  Keep all follow-up visits as told by your doctor. This is important. Contact a doctor if:  You have a fever.  Your diarrhea gets worse.  You have new symptoms.  You cannot keep fluids down.  You feel light-headed or dizzy.  You have a headache.  You have muscle cramps. Get help right away if:  You have chest pain.  You feel very weak or you pass out (faint).  You have bloody or black poop or poop that look like tar.  You have very bad pain, cramping, or bloating in your belly (abdomen).  You have trouble breathing or you are breathing very quickly.  Your heart is beating very quickly.  Your skin feels cold and clammy.  You feel confused.  You have signs of dehydration, such as: ? Dark pee, hardly any pee, or no pee. ? Cracked lips. ? Dry mouth. ? Sunken eyes. ? Sleepiness. ? Weakness. This information is not intended to replace advice given to you by your health care provider. Make sure you discuss any questions you have with your health care provider. Document Released: 03/28/2008   Document Revised: 04/29/2016 Document Reviewed: 06/16/2015 Elsevier Interactive Patient Education  2018 Elsevier Inc.  

## 2017-11-02 ENCOUNTER — Ambulatory Visit (INDEPENDENT_AMBULATORY_CARE_PROVIDER_SITE_OTHER): Payer: Federal, State, Local not specified - PPO

## 2017-11-02 ENCOUNTER — Telehealth: Payer: Self-pay | Admitting: Adult Health

## 2017-11-02 ENCOUNTER — Ambulatory Visit: Payer: Federal, State, Local not specified - PPO | Admitting: Physician Assistant

## 2017-11-02 ENCOUNTER — Other Ambulatory Visit: Payer: Self-pay

## 2017-11-02 ENCOUNTER — Encounter: Payer: Self-pay | Admitting: Physician Assistant

## 2017-11-02 VITALS — BP 128/62 | HR 102 | Temp 97.9°F | Resp 16 | Ht 67.0 in | Wt 176.0 lb

## 2017-11-02 DIAGNOSIS — Z87898 Personal history of other specified conditions: Secondary | ICD-10-CM

## 2017-11-02 DIAGNOSIS — R51 Headache: Secondary | ICD-10-CM | POA: Diagnosis not present

## 2017-11-02 DIAGNOSIS — R12 Heartburn: Secondary | ICD-10-CM

## 2017-11-02 DIAGNOSIS — R9431 Abnormal electrocardiogram [ECG] [EKG]: Secondary | ICD-10-CM | POA: Diagnosis not present

## 2017-11-02 DIAGNOSIS — R519 Headache, unspecified: Secondary | ICD-10-CM

## 2017-11-02 DIAGNOSIS — R079 Chest pain, unspecified: Secondary | ICD-10-CM | POA: Diagnosis not present

## 2017-11-02 DIAGNOSIS — R21 Rash and other nonspecific skin eruption: Secondary | ICD-10-CM | POA: Diagnosis not present

## 2017-11-02 LAB — TROPONIN I: Troponin I: <0.01 ng/mL (ref 0.00–0.04)

## 2017-11-02 LAB — POCT SEDIMENTATION RATE: POCT SED RATE: 54 mm/h — AB (ref 0–22)

## 2017-11-02 MED ORDER — PREDNISONE 20 MG PO TABS: ORAL_TABLET | ORAL | 0 refills | Status: DC

## 2017-11-02 NOTE — Patient Instructions (Addendum)
You will stop the doxycycline at this time.   If you have any new chest pains, you need to go to the emergency department.   We are getting troponins, if this is positive, you must go immediately to the emergency department.  Please await the call.   Because you are not having symptoms and likely cause of your symptoms, we can hold off.  Nonspecific Chest Pain Chest pain can be caused by many different conditions. There is a chance that your pain could be related to something serious, such as a heart attack or a blood clot in your lungs. Chest pain can also be caused by conditions that are not life-threatening. If you have chest pain, it is very important to follow up with your doctor. Follow these instructions at home: Medicines  If you were prescribed an antibiotic medicine, take it as told by your doctor. Do not stop taking the antibiotic even if you start to feel better.  Take over-the-counter and prescription medicines only as told by your doctor. Lifestyle  Do not use any products that contain nicotine or tobacco, such as cigarettes and e-cigarettes. If you need help quitting, ask your doctor.  Do not drink alcohol.  Make lifestyle changes as told by your doctor. These may include: ? Getting regular exercise. Ask your doctor for some activities that are safe for you. ? Eating a heart-healthy diet. A diet specialist (dietitian) can help you to learn healthy eating options. ? Staying at a healthy weight. ? Managing diabetes, if needed. ? Lowering your stress, as with deep breathing or spending time in nature. General instructions  Avoid any activities that make you feel chest pain.  If your chest pain is because of heartburn: ? Raise (elevate) the head of your bed about 6 inches (15 cm). You can do this by putting blocks under the bed legs at the head of the bed. ? Do not sleep with extra pillows under your head. That does not help heartburn.  Keep all follow-up visits as told by  your doctor. This is important. This includes any further testing if your chest pain does not go away. Contact a doctor if:  Your chest pain does not go away.  You have a rash with blisters on your chest.  You have a fever.  You have chills. Get help right away if:  Your chest pain is worse.  You have a cough that gets worse, or you cough up blood.  You have very bad (severe) pain in your belly (abdomen).  You are very weak.  You pass out (faint).  You have either of these for no clear reason: ? Sudden chest discomfort. ? Sudden discomfort in your arms, back, neck, or jaw.  You have shortness of breath at any time.  You suddenly start to sweat, or your skin gets clammy.  You feel sick to your stomach (nauseous).  You throw up (vomit).  You suddenly feel light-headed or dizzy.  Your heart starts to beat fast, or it feels like it is skipping beats. These symptoms may be an emergency. Do not wait to see if the symptoms will go away. Get medical help right away. Call your local emergency services (911 in the [place] Do not drive yourself to the hospital. This information is not intended to replace advice given to you by your health care provider. Make sure you discuss any questions you have with your health care provider. Document Released: 03/28/2008 Document Revised: 07/04/2016 Document Reviewed: 07/04/2016 Elsevier  Interactive Patient Education  2017 [employer]      IF you received an x-ray today, you will receive an invoice from [facility]. Please contact [facility] at [phone] with questions or concerns regarding your invoice.   IF you received labwork today, you will receive an invoice from [facility]. Please contact [facility] at [phone] with questions or concerns regarding your invoice.   Our billing staff will not be able to assist you with questions regarding bills from these companies.  You will be contacted with the lab results  as soon as they are available. The fastest way to get your results is to activate your My Chart account. Instructions are located on the last page of this paperwork. If you have not heard from us regarding the results in 2 weeks, please contact this office.

## 2017-11-02 NOTE — Telephone Encounter (Signed)
[facility] called result of Troponin I <0.01. Called [place] and notified (test ordered by Trena PlattStephanie English PA) to Lurline Hareevina Galloway

## 2017-11-02 NOTE — Progress Notes (Signed)
[facility]IMARY C[facility] AT [facility] [address] [place] [address], [place] [phone]  Date:  11/02/2017   Name:  Travis FramesJohn L Nuno Jr.   DOB:  08-14-1961   MRN:  [id number]  PCP:  Shirline FreesNafziger, Cory, NP    History of Present Illness:  Travis FramesJohn L Mosquera Jr. is a 57 y.o. male patient who presents to PCP with  Chief Complaint  Patient presents with  . Allergic Reaction    pt states his hands and forearm swell up. pt thinks it from meds he is takin  . Heartburn    pt states he feels gas in his chest     Couple days later, he developed acid reflux that he had severe.  He was given famotidine, solu medrol, and benadryl--with rash.   He is not taking meloxicam at this point.   Heart burn present with rest or exertion.  He notes no diaphoresis or nausea, but does have some associated sob.   He has taken the doxycycline on and off, and has a few tablets left.  Last adminstration was 2 days ago.  Last symptoms yesterday.   Swelling has improved and rash of his hands.  This resolved following the steroid administration initially at the visit.  There is history of rash and odd symptoms several years ago circulated around the use of doxycycline prior.    Patient Active Problem List   Diagnosis Date Noted  . Itching 10/27/2017  . Diarrhea of presumed infectious origin 10/27/2017  . Right shoulder pain 07/21/2015  . Hyponatremia 03/06/2015  . Renal insufficiency, mild 03/06/2015  . Left knee pain 01/11/2013  . Hives 06/11/2012  . Preventative health care 12/07/2011  . HYPERGLYCEMIA 06/18/2009  . PRIMARY HYPERCOAGULABLE STATE 07/09/2008  . TOBACCO ABUSE 07/09/2008  . Coronary atherosclerosis 07/09/2008    Past Medical History:  Diagnosis Date  . Abnormal glucose   . Abnormal partial thromboplastin time (PTT)   . Arthritis    right shoulder pain   . Lupus anticoagulant positive    but negative for antiphosolipid antibody and anti B2 glycoprotein antibody  . Palpitations     History reviewed. No  pertinent surgical history.  Social History   Tobacco Use  . Smoking status: Current Some Day Smoker    Packs/day: 0.25    Types: Cigarettes    Last attempt to quit: 05/24/2012    Years since quitting: 5.4  . Smokeless tobacco: Never Used  . Tobacco comment: trying to quit  Substance Use Topics  . Alcohol use: Yes    Alcohol/week: 0.0 oz    Comment: occ  . Drug use: No    Family History  Problem Relation Age of Onset  . Hypertension Father   . Lung cancer Father   . Hypertension Mother     Allergies  Allergen Reactions  . Zegerid [Omeprazole-Sodium Bicarbonate] Anaphylaxis    Rash, lip and hand swelling, itching , throat swelling    Medication list has been reviewed and updated.  Current Outpatient Medications on File Prior to Visit  Medication Sig Dispense Refill  . aspirin 81 MG tablet Take 81 mg by mouth daily.    . Fish Oil OIL by Does not apply route daily at 12 noon.    . hydrOXYzine (VISTARIL) 25 MG capsule Take 1 capsule (25 mg total) by mouth 3 (three) times daily as needed. 30 capsule 0  . meloxicam (MOBIC) 15 MG tablet Take 1 tablet (15 mg total) by mouth daily. 90 tablet 1  .  Multiple Vitamins-Minerals (MULTIVITAMIN ADULT PO) Take 1 tablet by mouth daily.    Marland Kitchen doxycycline (VIBRAMYCIN) 100 MG capsule Take 1 capsule (100 mg total) by mouth 2 (two) times daily. (Patient not taking: Reported on 10/21/2017) 14 capsule 0  . famotidine (PEPCID) 40 MG tablet Take 1 tablet (40 mg total) by mouth daily for 7 days. 7 tablet 0   Current Facility-Administered Medications on File Prior to Visit  Medication Dose Route Frequency Provider Last Rate Last Dose  . methylPREDNISolone acetate (DEPO-MEDROL) injection 80 mg  80 mg Intra-articular Once Nafziger, Cory, NP        ROS ROS otherwise unremarkable unless listed above.  Physical Examination: BP 128/62   Pulse (!) 102   Temp 97.9 F (36.6 C) (Oral)   Resp 16   Ht 5\' 7"  (1.702 m)   Wt 176 lb (79.8 kg)   SpO2 97%    BMI 27.57 kg/m  Ideal Body Weight: Weight in (lb) to have BMI = 25: 159.3  Physical Exam  Dg Chest 2 View  Result Date: 11/02/2017 CLINICAL DATA:  Chest pain EXAM: CHEST  2 VIEW COMPARISON:  04/08/2015 FINDINGS: Heart and mediastinal contours are within normal limits. No focal opacities or effusions. No acute bony abnormality. IMPRESSION: No active cardiopulmonary disease. Electronically Signed   By: Charlett Nose M.D.   On: 11/02/2017 11:54   ekg changes.   Assessment and Plan: Travis Borquez. is a 57 y.o. male who is here today for cc of  Chief Complaint  Patient presents with  . Allergic Reaction    pt states his hands and forearm swell up. pt thinks it from meds he is takin  . Heartburn    pt states he feels gas in his chest  possible allergic reaction to the doxycycline.  Heartburn could be co-related to both the doxycycline and the prednisone.  Advised to follow up in 3 days.  If symptoms resolve, may need to add doxycycline to allergy list, as this appears to be a factor at this time.  Rash and nonspecific skin eruption - Plan: predniSONE (DELTASONE) 20 MG tablet  Heartburn - Plan: EKG 12-Lead, Troponin I, POCT SEDIMENTATION RATE, Ambulatory referral to Cardiology  Nonspecific abnormal electrocardiogram (ECG) (EKG) - Plan: DG Chest 2 View, Ambulatory referral to Cardiology  Nonintractable headache, unspecified chronicity pattern, unspecified headache type - Plan: POCT SEDIMENTATION RATE, Ambulatory referral to Cardiology  History of chest pain - Plan: EKG 12-Lead, DG Chest 2 View, Troponin I, Ambulatory referral to Cardiology  Travis Platt, PA-C [facility] 1/21/20197:27 AM

## 2017-11-03 ENCOUNTER — Telehealth: Payer: Self-pay

## 2017-11-03 NOTE — Telephone Encounter (Signed)
Pt advised.   Copied from CRM [id number]. Topic: Quick Communication - Lab Results >> Nov 02, 2017  6:43 PM Alexander BergeronBarksdale, Travis Rice: Reason for CRM: pt called b/c he got disconnected on the phone speaking w/ a nurse about lab results that were issued by Dr. Trena PlattStephanie Rice, contact pt to advise   >> Nov 03, 2017  9:27 AM Travis MellowFoltz, Travis Rice Rice: Pt called back - he would like return call about labs

## 2017-11-06 ENCOUNTER — Ambulatory Visit: Payer: Federal, State, Local not specified - PPO | Admitting: Physician Assistant

## 2017-12-07 ENCOUNTER — Ambulatory Visit (INDEPENDENT_AMBULATORY_CARE_PROVIDER_SITE_OTHER): Payer: Federal, State, Local not specified - PPO | Admitting: Adult Health

## 2017-12-07 ENCOUNTER — Other Ambulatory Visit: Payer: Self-pay | Admitting: Adult Health

## 2017-12-07 ENCOUNTER — Encounter: Payer: Self-pay | Admitting: Adult Health

## 2017-12-07 ENCOUNTER — Other Ambulatory Visit (INDEPENDENT_AMBULATORY_CARE_PROVIDER_SITE_OTHER): Payer: Federal, State, Local not specified - PPO

## 2017-12-07 VITALS — BP 130/82 | Temp 98.8°F | Ht 68.0 in | Wt 177.0 lb

## 2017-12-07 DIAGNOSIS — Z125 Encounter for screening for malignant neoplasm of prostate: Secondary | ICD-10-CM

## 2017-12-07 DIAGNOSIS — Z0000 Encounter for general adult medical examination without abnormal findings: Secondary | ICD-10-CM | POA: Diagnosis not present

## 2017-12-07 DIAGNOSIS — Z1159 Encounter for screening for other viral diseases: Secondary | ICD-10-CM | POA: Diagnosis not present

## 2017-12-07 DIAGNOSIS — R7989 Other specified abnormal findings of blood chemistry: Secondary | ICD-10-CM

## 2017-12-07 DIAGNOSIS — F17200 Nicotine dependence, unspecified, uncomplicated: Secondary | ICD-10-CM | POA: Diagnosis not present

## 2017-12-07 LAB — HEPATIC FUNCTION PANEL
ALT: 27 U/L (ref 0–53)
AST: 20 U/L (ref 0–37)
Albumin: 4.5 g/dL (ref 3.5–5.2)
Alkaline Phosphatase: 59 U/L (ref 39–117)
Bilirubin, Direct: 0.1 mg/dL (ref 0.0–0.3)
TOTAL PROTEIN: 7.1 g/dL (ref 6.0–8.3)
Total Bilirubin: 0.9 mg/dL (ref 0.2–1.2)

## 2017-12-07 LAB — CBC WITH DIFFERENTIAL/PLATELET
BASOS PCT: 0.5 % (ref 0.0–3.0)
Basophils Absolute: 0 10*3/uL (ref 0.0–0.1)
Eosinophils Absolute: 0.3 10*3/uL (ref 0.0–0.7)
Eosinophils Relative: 5.5 % — ABNORMAL HIGH (ref 0.0–5.0)
HEMATOCRIT: 41.7 % (ref 39.0–52.0)
Hemoglobin: 14.2 g/dL (ref 13.0–17.0)
Lymphocytes Relative: 30.1 % (ref 12.0–46.0)
Lymphs Abs: 1.8 10*3/uL (ref 0.7–4.0)
MCHC: 34.1 g/dL (ref 30.0–36.0)
MCV: 93.5 fl (ref 78.0–100.0)
MONOS PCT: 12.4 % — AB (ref 3.0–12.0)
Monocytes Absolute: 0.7 10*3/uL (ref 0.1–1.0)
NEUTROS ABS: 3 10*3/uL (ref 1.4–7.7)
Neutrophils Relative %: 51.5 % (ref 43.0–77.0)
PLATELETS: 404 10*3/uL — AB (ref 150.0–400.0)
RBC: 4.46 Mil/uL (ref 4.22–5.81)
RDW: 14.1 % (ref 11.5–15.5)
WBC: 5.9 10*3/uL (ref 4.0–10.5)

## 2017-12-07 LAB — LIPID PANEL
CHOL/HDL RATIO: 3
Cholesterol: 219 mg/dL — ABNORMAL HIGH (ref 0–200)
HDL: 74.9 mg/dL (ref >39.00)
LDL Cholesterol: 126 mg/dL — ABNORMAL HIGH (ref 0–99)
NONHDL: 144.21
TRIGLYCERIDES: 89 mg/dL (ref 0.0–149.0)
VLDL: 17.8 mg/dL (ref 0.0–40.0)

## 2017-12-07 LAB — BASIC METABOLIC PANEL
BUN: 17 mg/dL (ref 6–23)
CHLORIDE: 106 meq/L (ref 96–112)
CO2: 28 meq/L (ref 19–32)
Calcium: 9.6 mg/dL (ref 8.4–10.5)
Creatinine, Ser: 1.12 mg/dL (ref 0.40–1.50)
GFR: 86.3 mL/min (ref >60.00)
Glucose, Bld: 105 mg/dL — ABNORMAL HIGH (ref 70–99)
POTASSIUM: 4.7 meq/L (ref 3.5–5.1)
SODIUM: 142 meq/L (ref 135–145)

## 2017-12-07 LAB — HEMOGLOBIN A1C: Hgb A1c MFr Bld: 6.2 % (ref 4.6–6.5)

## 2017-12-07 LAB — T3, FREE: T3 FREE: 3.7 pg/mL (ref 2.3–4.2)

## 2017-12-07 LAB — T4, FREE: FREE T4: 0.69 ng/dL (ref 0.60–1.60)

## 2017-12-07 LAB — PSA: PSA: 0.45 ng/mL (ref 0.10–4.00)

## 2017-12-07 LAB — TSH: TSH: 0.21 u[IU]/mL — AB (ref 0.35–4.50)

## 2017-12-07 MED ORDER — VARENICLINE TARTRATE 1 MG PO TABS
1.0000 mg | ORAL_TABLET | Freq: Two times a day (BID) | ORAL | 2 refills | Status: DC
Start: 2017-12-07 — End: 2018-06-19

## 2017-12-07 NOTE — Progress Notes (Unsigned)
uptake

## 2017-12-07 NOTE — Progress Notes (Signed)
Subjective:    Patient ID: Travis Rice., male    DOB: 1961/04/11, 57 y.o.   MRN: [id number]  HPI  Patient presents for yearly preventative medicine examination. He is a pleasant 57 year old male who  has a past medical history of Abnormal glucose, Abnormal partial thromboplastin time (PTT), Arthritis, Lupus anticoagulant positive, and Palpitations.   Tobacco Use  - He has been using Chantix and has cut back on on smoking. He is currently smoking 2 cigarettes per day on the weekend. Has had no side effects of Chantix   All immunizations and health maintenance protocols were reviewed with the patient and needed orders were placed. He is UTD on vaccinations   Appropriate screening laboratory values were ordered for the patient including screening of hyperlipidemia, renal function and hepatic function. If indicated by BPH, a PSA was ordered.  Medication reconciliation,  past medical history, social history, problem list and allergies were reviewed in detail with the patient  Goals were established with regard to weight loss, exercise, and  diet in compliance with medications. He works as a Museum/gallery curator. He does not exercise outside of this. He is trying to eat healthy.   He is up to date on his colonoscopy. He has routine vision exams   Review of Systems  Constitutional: Negative.   HENT: Negative.   Eyes: Negative.   Respiratory: Negative.   Cardiovascular: Negative.   Gastrointestinal: Negative.   Endocrine: Negative.   Genitourinary: Negative.   Musculoskeletal: Negative.   Skin: Negative.   Allergic/Immunologic: Negative.   Neurological: Negative.   Hematological: Negative.   Psychiatric/Behavioral: Negative.   All other systems reviewed and are negative.  Past Medical History:  Diagnosis Date  . Abnormal glucose   . Abnormal partial thromboplastin time (PTT)   . Arthritis    right shoulder pain   . Lupus anticoagulant positive    but negative for antiphosolipid  antibody and anti B2 glycoprotein antibody  . Palpitations     Social History   Socioeconomic History  . Marital status: Divorced    Spouse name: Not on file  . Number of children: Not on file  . Years of education: Not on file  . Highest education level: Not on file  Social Needs  . Financial resource strain: Not on file  . Food insecurity - worry: Not on file  . Food insecurity - inability: Not on file  . Transportation needs - medical: Not on file  . Transportation needs - non-medical: Not on file  Occupational History  . Not on file  Tobacco Use  . Smoking status: Current Some Day Smoker    Packs/day: 0.25    Types: Cigarettes    Last attempt to quit: 05/24/2012    Years since quitting: 5.5  . Smokeless tobacco: Never Used  . Tobacco comment: trying to quit  Substance and Sexual Activity  . Alcohol use: Yes    Alcohol/week: 0.0 oz    Comment: occ  . Drug use: No  . Sexual activity: Not on file  Other Topics Concern  . Not on file  Social History Narrative  . Not on file    History reviewed. No pertinent surgical history.  Family History  Problem Relation Age of Onset  . Hypertension Father   . Lung cancer Father   . Hypertension Mother     Allergies  Allergen Reactions  . Zegerid [Omeprazole-Sodium Bicarbonate] Anaphylaxis    Rash, lip and hand swelling, itching ,  throat swelling    Current Outpatient Medications on File Prior to Visit  Medication Sig Dispense Refill  . aspirin 81 MG tablet Take 81 mg by mouth daily.    . Fish Oil OIL by Does not apply route daily at 12 noon.    . Multiple Vitamins-Minerals (MULTIVITAMIN ADULT PO) Take 1 tablet by mouth daily.     Current Facility-Administered Medications on File Prior to Visit  Medication Dose Route Frequency Provider Last Rate Last Dose  . methylPREDNISolone acetate (DEPO-MEDROL) injection 80 mg  80 mg Intra-articular Once Alisea Matte, NP        BP 130/82 (BP Location: Left Arm)   Temp 98.8 F  (37.1 C) (Oral)   Ht 5\' 8"  (1.727 m)   Wt 177 lb (80.3 kg)   BMI 26.91 kg/m       Objective:   Physical Exam  Constitutional: He is oriented to person, place, and time. He appears well-developed and well-nourished. No distress.  Slightly overweight    HENT:  Head: Normocephalic and atraumatic.  Right Ear: External ear normal.  Left Ear: External ear normal.  Nose: Nose normal.  Mouth/Throat: Uvula is midline and oropharynx is clear and moist. He has dentures. No oropharyngeal exudate.  Eyes: Conjunctivae and EOM are normal. Pupils are equal, round, and reactive to light. Right eye exhibits no discharge. Left eye exhibits no discharge. No scleral icterus.  Neck: Normal range of motion. Neck supple. No JVD present. No tracheal deviation present. No thyromegaly present.  Cardiovascular: Normal rate, regular rhythm, normal heart sounds and intact distal pulses. Exam reveals no gallop and no friction rub.  No murmur heard. Pulmonary/Chest: Effort normal and breath sounds normal. No stridor. No respiratory distress. He has no wheezes. He has no rales. He exhibits no tenderness.  Abdominal: Soft. Bowel sounds are normal. He exhibits no distension and no mass. There is no tenderness. There is no rebound and no guarding.  Genitourinary:  Genitourinary Comments: Will do psa   Musculoskeletal: Normal range of motion. He exhibits no edema, tenderness or deformity.  Lymphadenopathy:    He has no cervical adenopathy.  Neurological: He is alert and oriented to person, place, and time. He has normal reflexes. He displays normal reflexes. No cranial nerve deficit. He exhibits normal muscle tone. Coordination normal.  Skin: Skin is warm and dry. No rash noted. He is not diaphoretic. No erythema. No pallor.  Psychiatric: He has a normal mood and affect. His behavior is normal. Judgment and thought content normal.  Nursing note and vitals reviewed.     Assessment & Plan:  1. Routine general medical  examination at a health care facility - Needs to get in the gym. Continue to eat healthy - Follow up in one year or sooner if needed - Basic metabolic panel - CBC with Differential/Platelet - Hemoglobin A1c - Hepatic function panel - Lipid panel - TSH  2. TOBACCO ABUSE - Continue with cutting back on smoking. I would like him to be smoke free by the time he sees me next  - varenicline (CHANTIX CONTINUING MONTH PAK) 1 MG tablet; Take 1 tablet (1 mg total) by mouth 2 (two) times daily.  Dispense: 60 tablet; Refill: 2  3. Prostate cancer screening  - PSA  4. Need for hepatitis C screening test  - Hep C Antibody  Shirline Frees, NP

## 2017-12-08 LAB — HEPATITIS C ANTIBODY
Hepatitis C Ab: NONREACTIVE
SIGNAL TO CUT-OFF: 0.01 (ref <1.00)

## 2017-12-13 ENCOUNTER — Other Ambulatory Visit: Payer: Self-pay | Admitting: Family Medicine

## 2017-12-13 MED ORDER — ATORVASTATIN CALCIUM 20 MG PO TABS: 20.0000 mg | ORAL_TABLET | Freq: Every day | ORAL | 3 refills | Status: DC

## 2018-01-31 ENCOUNTER — Encounter: Payer: Self-pay | Admitting: Physician Assistant

## 2018-06-19 ENCOUNTER — Encounter: Payer: Self-pay | Admitting: Adult Health

## 2018-06-19 ENCOUNTER — Ambulatory Visit: Payer: Federal, State, Local not specified - PPO | Admitting: Adult Health

## 2018-06-19 VITALS — BP 136/84 | Temp 98.4°F | Wt 174.0 lb

## 2018-06-19 DIAGNOSIS — N529 Male erectile dysfunction, unspecified: Secondary | ICD-10-CM | POA: Diagnosis not present

## 2018-06-19 DIAGNOSIS — F17200 Nicotine dependence, unspecified, uncomplicated: Secondary | ICD-10-CM | POA: Diagnosis not present

## 2018-06-19 MED ORDER — SILDENAFIL CITRATE 20 MG PO TABS: ORAL_TABLET | ORAL | 2 refills | Status: DC

## 2018-06-19 MED ORDER — VARENICLINE TARTRATE 1 MG PO TABS: 1.0000 mg | ORAL_TABLET | Freq: Two times a day (BID) | ORAL | 1 refills | Status: DC

## 2018-06-19 NOTE — Progress Notes (Signed)
Subjective:    Patient ID: Dorisann FramesJohn L Eberly Jr., male    DOB: 05-14-1961, 57 y.o.   MRN: [id number]  HPI 57 year old male who  has a past medical history of Abnormal glucose, Abnormal partial thromboplastin time (PTT), Arthritis, Lupus anticoagulant positive, and Palpitations.  He presents to the office today for follow up regarding smoking cessation   He was also prescribed Chantix to help him quit smoking. He reports that he continues to smoke but has cut back to 3-4 cigarettes per day. He is down from half a pack. He would a refill of chantix   He reports having issues with ED. Feels as though this has been becoming worse. Has trouble getting and maintaining    Review of Systems See HPI   Past Medical History:  Diagnosis Date  . Abnormal glucose   . Abnormal partial thromboplastin time (PTT)   . Arthritis    right shoulder pain   . Lupus anticoagulant positive    but negative for antiphosolipid antibody and anti B2 glycoprotein antibody  . Palpitations     Social History   Socioeconomic History  . Marital status: Divorced    Spouse name: Not on file  . Number of children: Not on file  . Years of education: Not on file  . Highest education level: Not on file  Occupational History  . Not on file  Social Needs  . Financial resource strain: Not on file  . Food insecurity:    Worry: Not on file    Inability: Not on file  . Transportation needs:    Medical: Not on file    Non-medical: Not on file  Tobacco Use  . Smoking status: Current Some Day Smoker    Packs/day: 0.25    Types: Cigarettes    Last attempt to quit: 05/24/2012    Years since quitting: 6.0  . Smokeless tobacco: Never Used  . Tobacco comment: trying to quit  Substance and Sexual Activity  . Alcohol use: Yes    Alcohol/week: 0.0 standard drinks    Comment: occ  . Drug use: No  . Sexual activity: Not on file  Lifestyle  . Physical activity:    Days per week: Not on file    Minutes per session: Not  on file  . Stress: Not on file  Relationships  . Social connections:    Talks on phone: Not on file    Gets together: Not on file    Attends religious service: Not on file    Active member of club or organization: Not on file    Attends meetings of clubs or organizations: Not on file    Relationship status: Not on file  . Intimate partner violence:    Fear of current or ex partner: Not on file    Emotionally abused: Not on file    Physically abused: Not on file    Forced sexual activity: Not on file  Other Topics Concern  . Not on file  Social History Narrative  . Not on file    History reviewed. No pertinent surgical history.  Family History  Problem Relation Age of Onset  . Hypertension Father   . Lung cancer Father   . Hypertension Mother     Allergies  Allergen Reactions  . Zegerid [Omeprazole-Sodium Bicarbonate] Anaphylaxis    Rash, lip and hand swelling, itching , throat swelling    Current Outpatient Medications on File Prior to Visit  Medication Sig Dispense Refill  .  aspirin 81 MG tablet Take 81 mg by mouth daily.    Marland Kitchen atorvastatin (LIPITOR) 20 MG tablet Take 1 tablet (20 mg total) by mouth daily. 90 tablet 3  . varenicline (CHANTIX CONTINUING MONTH PAK) 1 MG tablet Take 1 tablet (1 mg total) by mouth 2 (two) times daily. (Patient not taking: Reported on 06/19/2018) 60 tablet 2   No current facility-administered medications on file prior to visit.     BP 136/84   Temp 98.4 F (36.9 C) (Oral)   Wt 174 lb (78.9 kg)   BMI 26.46 kg/m       Objective:   Physical Exam  Constitutional: He is oriented to person, place, and time. He appears well-developed and well-nourished. No distress.  Cardiovascular: Normal rate, regular rhythm, normal heart sounds and intact distal pulses.  Pulmonary/Chest: Effort normal and breath sounds normal.  Neurological: He is alert and oriented to person, place, and time.  Skin: Skin is warm and dry. He is not diaphoretic.    Psychiatric: He has a normal mood and affect. His behavior is normal. Judgment and thought content normal.  Nursing note and vitals reviewed.     Assessment & Plan:  1. TOBACCO ABUSE - Encouraged to quit smoking  - varenicline (CHANTIX CONTINUING MONTH PAK) 1 MG tablet; Take 1 tablet (1 mg total) by mouth 2 (two) times daily.  Dispense: 180 tablet; Refill: 1  2. Erectile dysfunction, unspecified erectile dysfunction type - likely due due to smoking  - sildenafil (REVATIO) 20 MG tablet; Take 1-4 tabs as needed  Dispense: 20 tablet; Refill: 2   Shirline Frees, NP

## 2018-07-05 ENCOUNTER — Ambulatory Visit: Payer: Federal, State, Local not specified - PPO | Admitting: Family Medicine

## 2018-07-19 ENCOUNTER — Ambulatory Visit (INDEPENDENT_AMBULATORY_CARE_PROVIDER_SITE_OTHER): Payer: Federal, State, Local not specified - PPO

## 2018-07-19 ENCOUNTER — Ambulatory Visit: Payer: Federal, State, Local not specified - PPO | Admitting: Adult Health

## 2018-07-19 ENCOUNTER — Encounter: Payer: Self-pay | Admitting: Adult Health

## 2018-07-19 VITALS — BP 124/62 | HR 102 | Temp 98.2°F | Wt 174.2 lb

## 2018-07-19 DIAGNOSIS — M25571 Pain in right ankle and joints of right foot: Secondary | ICD-10-CM | POA: Diagnosis not present

## 2018-07-19 NOTE — Progress Notes (Signed)
Subjective:    Patient ID: Dorisann Frames., male    DOB: May 24, 1961, 57 y.o.   MRN: [id number]  HPI   57 year old male who  has a past medical history of Abnormal glucose, Abnormal partial thromboplastin time (PTT), Arthritis, Lupus anticoagulant positive, and Palpitations.  He presents to the office today for an acute issue of right heel pain. Reports pain started about 10 days ago. Denies any trauma. Pain is constant and is worse as the day goes on while he is walking at work and with palpation He denies any redness, bruising, warmth or swelling.   Review of Systems See HPI   Past Medical History:  Diagnosis Date  . Abnormal glucose   . Abnormal partial thromboplastin time (PTT)   . Arthritis    right shoulder pain   . Lupus anticoagulant positive    but negative for antiphosolipid antibody and anti B2 glycoprotein antibody  . Palpitations     Social History   Socioeconomic History  . Marital status: Divorced    Spouse name: Not on file  . Number of children: Not on file  . Years of education: Not on file  . Highest education level: Not on file  Occupational History  . Not on file  Social Needs  . Financial resource strain: Not on file  . Food insecurity:    Worry: Not on file    Inability: Not on file  . Transportation needs:    Medical: Not on file    Non-medical: Not on file  Tobacco Use  . Smoking status: Current Some Day Smoker    Packs/day: 0.25    Types: Cigarettes    Last attempt to quit: 05/24/2012    Years since quitting: 6.1  . Smokeless tobacco: Never Used  . Tobacco comment: trying to quit  Substance and Sexual Activity  . Alcohol use: Yes    Alcohol/week: 0.0 standard drinks    Comment: occ  . Drug use: No  . Sexual activity: Not on file  Lifestyle  . Physical activity:    Days per week: Not on file    Minutes per session: Not on file  . Stress: Not on file  Relationships  . Social connections:    Talks on phone: Not on file    Gets  together: Not on file    Attends religious service: Not on file    Active member of club or organization: Not on file    Attends meetings of clubs or organizations: Not on file    Relationship status: Not on file  . Intimate partner violence:    Fear of current or ex partner: Not on file    Emotionally abused: Not on file    Physically abused: Not on file    Forced sexual activity: Not on file  Other Topics Concern  . Not on file  Social History Narrative  . Not on file    No past surgical history on file.  Family History  Problem Relation Age of Onset  . Hypertension Father   . Lung cancer Father   . Hypertension Mother     Allergies  Allergen Reactions  . Zegerid [Omeprazole-Sodium Bicarbonate] Anaphylaxis    Rash, lip and hand swelling, itching , throat swelling    Current Outpatient Medications on File Prior to Visit  Medication Sig Dispense Refill  . aspirin 81 MG tablet Take 81 mg by mouth daily.    Marland Kitchen atorvastatin (LIPITOR) 20 MG tablet  Take 1 tablet (20 mg total) by mouth daily. 90 tablet 3  . sildenafil (REVATIO) 20 MG tablet Take 1-4 tabs as needed 20 tablet 2  . varenicline (CHANTIX CONTINUING MONTH PAK) 1 MG tablet Take 1 tablet (1 mg total) by mouth 2 (two) times daily. 180 tablet 1   No current facility-administered medications on file prior to visit.     BP 124/62 (BP Location: Left Arm, Patient Position: Sitting, Cuff Size: Normal)   Pulse (!) 102   Temp 98.2 F (36.8 C) (Oral)   Wt 174 lb 3.2 oz (79 kg)   SpO2 95%   BMI 26.49 kg/m       Objective:   Physical Exam  Constitutional: He is oriented to person, place, and time. He appears well-developed and well-nourished. No distress.  Musculoskeletal: Normal range of motion. He exhibits tenderness. He exhibits no edema or deformity.  Pain with palpation along lateral aspect of right calcaneus. No redness, bruising, warmth or edema noted. Has full ROM but pain at the area of concern for dorsiflexion.  No masses felt. No pain along achilles tendon   Neurological: He is alert and oriented to person, place, and time.  Skin: Skin is warm and dry. He is not diaphoretic.  Psychiatric: He has a normal mood and affect. His behavior is normal. Judgment and thought content normal.  Nursing note and vitals reviewed.     Assessment & Plan:  1. Acute right ankle pain - Concern for stress fracture or bone spur.  - DG Ankle Complete Right; Future - Will follow up once xray is complete and treat as appropriate   Shirline Frees, NP

## 2018-07-20 ENCOUNTER — Telehealth: Payer: Self-pay | Admitting: Adult Health

## 2018-07-20 MED ORDER — METHYLPREDNISOLONE 4 MG PO TBPK: ORAL_TABLET | ORAL | 0 refills | Status: DC

## 2018-07-20 NOTE — Telephone Encounter (Signed)
Updated patient on xray of foot. He has a heel spur. Advised Nsaids for the next 2 weeks. If no improvement then will send to podiatry   Will also send in medrol dose pack to see if this helps with inflammation

## 2018-08-29 ENCOUNTER — Encounter: Payer: Self-pay | Admitting: Family Medicine

## 2018-08-29 ENCOUNTER — Ambulatory Visit: Payer: Federal, State, Local not specified - PPO | Admitting: Family Medicine

## 2018-08-29 VITALS — BP 130/80 | HR 110 | Temp 99.1°F | Wt 172.2 lb

## 2018-08-29 DIAGNOSIS — R059 Cough, unspecified: Secondary | ICD-10-CM

## 2018-08-29 DIAGNOSIS — R05 Cough: Secondary | ICD-10-CM | POA: Diagnosis not present

## 2018-08-29 MED ORDER — DOXYCYCLINE HYCLATE 100 MG PO TABS: 100.0000 mg | ORAL_TABLET | Freq: Two times a day (BID) | ORAL | 0 refills | Status: DC

## 2018-08-29 NOTE — Progress Notes (Signed)
  Dorisann Frames. DOB: 1961-06-29 Encounter date: 08/29/2018  This is a 57 y.o. male who presents with Chief Complaint  Patient presents with  . Generalized Body Aches    pt states his whole body hurts, hurts to brush his hair, chills, sore through, productive cough, green mucus    History of present illness:  Symptoms started with scratchy throat Sunday. By Monday evening had chills, fever, sweats, shakes. Sent home Tuesday from work. Now just feels like skin is sore.   Had dehydrated to point of needing hospitalization a couple of years ago. Just wanted to know how to handle it.   Headache behind eyes and top of head. Sleeping a few hours before waking up. Urine is darker. Not eating well. Trying to keep up with fluids.    Has a cigarette in evening only.  Allergies  Allergen Reactions  . Zegerid [Omeprazole-Sodium Bicarbonate] Anaphylaxis    Rash, lip and hand swelling, itching , throat swelling   Current Meds  Medication Sig  . aspirin 81 MG tablet Take 81 mg by mouth daily.  Marland Kitchen atorvastatin (LIPITOR) 20 MG tablet Take 1 tablet (20 mg total) by mouth daily.  . sildenafil (REVATIO) 20 MG tablet Take 1-4 tabs as needed  . [DISCONTINUED] methylPREDNISolone (MEDROL DOSEPAK) 4 MG TBPK tablet Take as directed  . [DISCONTINUED] varenicline (CHANTIX CONTINUING MONTH PAK) 1 MG tablet Take 1 tablet (1 mg total) by mouth 2 (two) times daily.    Review of Systems  Constitutional: Positive for chills, fatigue and fever.  Respiratory: Positive for cough (hurts to take deep breath). Negative for shortness of breath and wheezing.     Objective:  BP 130/80 (BP Location: Left Arm, Patient Position: Sitting, Cuff Size: Normal)   Pulse (!) 110   Temp 99.1 F (37.3 C) (Oral)   Wt 172 lb 3.2 oz (78.1 kg)   SpO2 96%   BMI 26.18 kg/m   Weight: 172 lb 3.2 oz (78.1 kg)   BP Readings from Last 3 Encounters:  08/29/18 130/80  07/19/18 124/62  06/19/18 136/84   Wt Readings from Last 3  Encounters:  08/29/18 172 lb 3.2 oz (78.1 kg)  07/19/18 174 lb 3.2 oz (79 kg)  06/19/18 174 lb (78.9 kg)    Physical Exam  Constitutional: He appears well-developed and well-nourished.  Non-toxic appearance. He appears ill.  HENT:  Right Ear: Tympanic membrane, external ear and ear canal normal.  Left Ear: Tympanic membrane, external ear and ear canal normal.  Nose: Mucosal edema present. Right sinus exhibits no maxillary sinus tenderness and no frontal sinus tenderness. Left sinus exhibits no maxillary sinus tenderness and no frontal sinus tenderness.  Mouth/Throat: Uvula is midline and oropharynx is clear and moist.  Cardiovascular: Normal rate, regular rhythm and normal heart sounds.  Pulmonary/Chest: Effort normal. He has decreased breath sounds. He has no wheezes. He has no rhonchi. He has no rales.    Assessment/Plan 1. Cough Suspect viral at this point; if worsening of symptoms then start antibiotic. Rest, fluids.      Return if symptoms worsen or fail to improve.    Theodis Shove, MD

## 2018-08-29 NOTE — Patient Instructions (Signed)
Rest, fluids, and if worsening of symptoms then start antibiotic as directed.   Ok to take OTC medications to help with symptoms in meanwhile - mucinex.

## 2018-10-14 ENCOUNTER — Other Ambulatory Visit: Payer: Self-pay | Admitting: Adult Health

## 2018-10-14 DIAGNOSIS — Z760 Encounter for issue of repeat prescription: Secondary | ICD-10-CM

## 2018-10-15 NOTE — Telephone Encounter (Signed)
Denied.  Pt is no longer taking this medication. 

## 2018-12-12 ENCOUNTER — Encounter: Payer: Self-pay | Admitting: Adult Health

## 2018-12-12 ENCOUNTER — Ambulatory Visit (INDEPENDENT_AMBULATORY_CARE_PROVIDER_SITE_OTHER): Payer: Federal, State, Local not specified - PPO | Admitting: Adult Health

## 2018-12-12 VITALS — BP 118/76 | Temp 98.5°F | Ht 68.0 in | Wt 177.0 lb

## 2018-12-12 DIAGNOSIS — Z125 Encounter for screening for malignant neoplasm of prostate: Secondary | ICD-10-CM | POA: Diagnosis not present

## 2018-12-12 DIAGNOSIS — I2510 Atherosclerotic heart disease of native coronary artery without angina pectoris: Secondary | ICD-10-CM | POA: Diagnosis not present

## 2018-12-12 DIAGNOSIS — F17200 Nicotine dependence, unspecified, uncomplicated: Secondary | ICD-10-CM | POA: Diagnosis not present

## 2018-12-12 DIAGNOSIS — Z0000 Encounter for general adult medical examination without abnormal findings: Secondary | ICD-10-CM

## 2018-12-12 LAB — COMPREHENSIVE METABOLIC PANEL
ALT: 52 U/L (ref 0–53)
AST: 41 U/L — AB (ref 0–37)
Albumin: 4.5 g/dL (ref 3.5–5.2)
Alkaline Phosphatase: 81 U/L (ref 39–117)
BUN: 20 mg/dL (ref 6–23)
CO2: 26 meq/L (ref 19–32)
CREATININE: 1.09 mg/dL (ref 0.40–1.50)
Calcium: 9.7 mg/dL (ref 8.4–10.5)
Chloride: 105 mEq/L (ref 96–112)
GFR: 83.49 mL/min (ref >60.00)
Glucose, Bld: 101 mg/dL — ABNORMAL HIGH (ref 70–99)
Potassium: 4.8 mEq/L (ref 3.5–5.1)
Sodium: 142 mEq/L (ref 135–145)
Total Bilirubin: 0.9 mg/dL (ref 0.2–1.2)
Total Protein: 7.3 g/dL (ref 6.0–8.3)

## 2018-12-12 LAB — HEMOGLOBIN A1C: Hgb A1c MFr Bld: 6.5 % (ref 4.6–6.5)

## 2018-12-12 LAB — LIPID PANEL
CHOL/HDL RATIO: 2
Cholesterol: 159 mg/dL (ref 0–200)
HDL: 70.5 mg/dL (ref >39.00)
LDL Cholesterol: 72 mg/dL (ref 0–99)
NonHDL: 88.48
Triglycerides: 81 mg/dL (ref 0.0–149.0)
VLDL: 16.2 mg/dL (ref 0.0–40.0)

## 2018-12-12 LAB — CBC WITH DIFFERENTIAL/PLATELET
Basophils Absolute: 0.1 10*3/uL (ref 0.0–0.1)
Basophils Relative: 0.9 % (ref 0.0–3.0)
Eosinophils Absolute: 0.5 10*3/uL (ref 0.0–0.7)
Eosinophils Relative: 7.1 % — ABNORMAL HIGH (ref 0.0–5.0)
HCT: 43.2 % (ref 39.0–52.0)
Hemoglobin: 14.8 g/dL (ref 13.0–17.0)
LYMPHS ABS: 1.7 10*3/uL (ref 0.7–4.0)
Lymphocytes Relative: 23.5 % (ref 12.0–46.0)
MCHC: 34.3 g/dL (ref 30.0–36.0)
MCV: 91.4 fl (ref 78.0–100.0)
Monocytes Absolute: 1.4 10*3/uL — ABNORMAL HIGH (ref 0.1–1.0)
Monocytes Relative: 18.9 % — ABNORMAL HIGH (ref 3.0–12.0)
Neutro Abs: 3.6 10*3/uL (ref 1.4–7.7)
Neutrophils Relative %: 49.6 % (ref 43.0–77.0)
Platelets: 407 10*3/uL — ABNORMAL HIGH (ref 150.0–400.0)
RBC: 4.72 Mil/uL (ref 4.22–5.81)
RDW: 14 % (ref 11.5–15.5)
WBC: 7.3 10*3/uL (ref 4.0–10.5)

## 2018-12-12 LAB — TSH: TSH: 0.28 u[IU]/mL — ABNORMAL LOW (ref 0.35–4.50)

## 2018-12-12 LAB — PSA: PSA: 0.42 ng/mL (ref 0.10–4.00)

## 2018-12-12 MED ORDER — VARENICLINE TARTRATE 1 MG PO TABS: 1.0000 mg | ORAL_TABLET | Freq: Two times a day (BID) | ORAL | 2 refills | Status: AC

## 2018-12-12 NOTE — Progress Notes (Signed)
Subjective:    Patient ID: Travis Rice., male    DOB: [birth year], 58 y.o.   MRN: [id number]  HPI  Patient presents for yearly preventative medicine examination. Pleasant 58 year old male who  has a past medical history of Abnormal glucose, Abnormal partial thromboplastin time (PTT), Arthritis, Lupus anticoagulant positive, and Palpitations. 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   Hyperlipidemia - takes Lipitor 20 mg and ASA 81 mg  Lab Results  Component Value Date   CHOL 219 (H) 12/07/2017   HDL 74.90 12/07/2017   LDLCALC 126 (H) 12/07/2017   LDLDIRECT 144.4 01/30/2013   TRIG 89.0 12/07/2017   CHOLHDL 3 12/07/2017   Tobacco Use - He continues to smoke about 2 cigarettes per day. Last year chantix was not covered by his insurance. He would like to see if it will be covered this year   Elevated glucose levels  Lab Results  Component Value Date   HGBA1C 6.2 12/07/2017    All immunizations and health maintenance protocols were reviewed with the patient and needed orders were placed. UTD  Appropriate screening laboratory values were ordered for the patient including screening of hyperlipidemia, renal function and hepatic function. If indicated by BPH, a PSA was ordered.  Medication reconciliation,  past medical history, social history, problem list and allergies were reviewed in detail with the patient  Goals were established with regard to weight loss, exercise, and  diet in compliance with medications. He is not exercising on a regular basis nor is he eating healthy.   Wt Readings from Last 3 Encounters:  12/12/18 177 lb (80.3 kg)  08/29/18 172 lb 3.2 oz (78.1 kg)  07/19/18 174 lb 3.2 oz (79 kg)    He is due for his next screening colonoscopy in July 2020  Review of Systems  Constitutional: Negative.   HENT: Negative.   Eyes: Negative.   Respiratory: Negative.   Cardiovascular: Negative.   Gastrointestinal: Negative.   Endocrine: Negative.     Genitourinary: Negative.   Musculoskeletal: Negative.   Skin: Negative.   Neurological: Negative.   Hematological: Negative.   Psychiatric/Behavioral: Negative.   All other systems reviewed and are negative.  Past Medical History:  Diagnosis Date  . Abnormal glucose   . Abnormal partial thromboplastin time (PTT)   . Arthritis    right shoulder pain   . Lupus anticoagulant positive    but negative for antiphosolipid antibody and anti B2 glycoprotein antibody  . Palpitations     Social History   Socioeconomic History  . Marital status: Divorced    Spouse name: Not on file  . Number of children: Not on file  . Years of education: Not on file  . Highest education level: Not on file  Occupational History  . Not on file  Social Needs  . Financial resource strain: Not on file  . Food insecurity:    Worry: Not on file    Inability: Not on file  . Transportation needs:    Medical: Not on file    Non-medical: Not on file  Tobacco Use  . Smoking status: Current Some Day Smoker    Packs/day: 0.25    Types: Cigarettes    Last attempt to quit: 05/24/2012    Years since quitting: 6.5  . Smokeless tobacco: Never Used  . Tobacco comment: trying to quit  Substance and Sexual Activity  . Alcohol use: Yes    Alcohol/week: 0.0 standard drinks  Comment: occ  . Drug use: No  . Sexual activity: Not on file  Lifestyle  . Physical activity:    Days per week: Not on file    Minutes per session: Not on file  . Stress: Not on file  Relationships  . Social connections:    Talks on phone: Not on file    Gets together: Not on file    Attends religious service: Not on file    Active member of club or organization: Not on file    Attends meetings of clubs or organizations: Not on file    Relationship status: Not on file  . Intimate partner violence:    Fear of current or ex partner: Not on file    Emotionally abused: Not on file    Physically abused: Not on file    Forced sexual  activity: Not on file  Other Topics Concern  . Not on file  Social History Narrative  . Not on file    History reviewed. No pertinent surgical history.  Family History  Problem Relation Age of Onset  . Hypertension Father   . Lung cancer Father   . Hypertension Mother     Allergies  Allergen Reactions  . Zegerid [Omeprazole-Sodium Bicarbonate] Anaphylaxis    Rash, lip and hand swelling, itching , throat swelling    Current Outpatient Medications on File Prior to Visit  Medication Sig Dispense Refill  . aspirin 81 MG tablet Take 81 mg by mouth daily.    Marland Kitchen atorvastatin (LIPITOR) 20 MG tablet Take 1 tablet (20 mg total) by mouth daily. 90 tablet 3  . sildenafil (REVATIO) 20 MG tablet Take 1-4 tabs as needed 20 tablet 2   No current facility-administered medications on file prior to visit.     BP 118/76   Temp 98.5 F (36.9 C)   Ht 5\' 8"  (1.727 m)   Wt 177 lb (80.3 kg)   BMI 26.91 kg/m       Objective:   Physical Exam Vitals signs and nursing note reviewed.  Constitutional:      General: He is not in acute distress.    Appearance: Normal appearance. He is well-developed and normal weight. He is not ill-appearing, toxic-appearing or diaphoretic.  HENT:     Head: Normocephalic and atraumatic.     Right Ear: Tympanic membrane, ear canal and external ear normal. There is no impacted cerumen.     Left Ear: Tympanic membrane, ear canal and external ear normal. There is no impacted cerumen.     Nose: Nose normal. No congestion or rhinorrhea.     Mouth/Throat:     Mouth: Mucous membranes are moist.     Pharynx: Oropharynx is clear. No oropharyngeal exudate.  Eyes:     General:        Right eye: No discharge.        Left eye: No discharge.     Conjunctiva/sclera: Conjunctivae normal.     Pupils: Pupils are equal, round, and reactive to light.  Neck:     Musculoskeletal: Normal range of motion and neck supple.     Thyroid: No thyromegaly.     Trachea: No tracheal  deviation.  Cardiovascular:     Rate and Rhythm: Normal rate and regular rhythm.     Heart sounds: Normal heart sounds. No murmur. No friction rub. No gallop.   Pulmonary:     Effort: Pulmonary effort is normal. No respiratory distress.     Breath sounds: Normal  breath sounds. No stridor. No wheezing, rhonchi or rales.  Chest:     Chest wall: No tenderness.  Abdominal:     General: Bowel sounds are normal. There is no distension.     Palpations: Abdomen is soft. There is no mass.     Tenderness: There is no abdominal tenderness. There is no right CVA tenderness, left CVA tenderness, guarding or rebound.     Hernia: No hernia is present.  Musculoskeletal: Normal range of motion.        General: No swelling, tenderness, deformity or signs of injury.     Right lower leg: No edema.     Left lower leg: No edema.  Lymphadenopathy:     Cervical: No cervical adenopathy.  Skin:    General: Skin is warm and dry.     Capillary Refill: Capillary refill takes less than 2 seconds.     Coloration: Skin is not jaundiced or pale.     Findings: No bruising, erythema, lesion or rash.  Neurological:     General: No focal deficit present.     Mental Status: He is alert and oriented to person, place, and time. Mental status is at baseline.     Cranial Nerves: No cranial nerve deficit.     Coordination: Coordination normal.  Psychiatric:        Mood and Affect: Mood normal.        Behavior: Behavior normal.        Thought Content: Thought content normal.        Judgment: Judgment normal.       Assessment & Plan:  1. Routine general medical examination at a health care facility - Needs to quit smoking  - Needs to work on moderate weight loss through diet and exercise - follow up in one year or sooner if needed - CBC with Differential/Platelet - Comprehensive metabolic panel - Hemoglobin A1c - Lipid panel - TSH  2. Prostate cancer screening  - PSA  3. TOBACCO ABUSE - Encouraged to quit  smoking  - varenicline (CHANTIX CONTINUING MONTH PAK) 1 MG tablet; Take 1 tablet (1 mg total) by mouth 2 (two) times daily for 30 days.  Dispense: 60 tablet; Refill: 2  4. Atherosclerosis of coronary artery of native heart without angina pectoris, unspecified vessel or lesion type - Consider increase in statin  - CBC with Differential/Platelet - Comprehensive metabolic panel - Hemoglobin A1c - Lipid panel - TSH   Shirline Frees, NP

## 2018-12-14 ENCOUNTER — Other Ambulatory Visit: Payer: Self-pay | Admitting: Family Medicine

## 2018-12-14 ENCOUNTER — Other Ambulatory Visit: Payer: Self-pay | Admitting: Adult Health

## 2018-12-14 DIAGNOSIS — R7989 Other specified abnormal findings of blood chemistry: Secondary | ICD-10-CM

## 2018-12-14 MED ORDER — METFORMIN HCL 500 MG PO TABS: 250.0000 mg | ORAL_TABLET | Freq: Every day | ORAL | 0 refills | Status: DC

## 2018-12-28 ENCOUNTER — Other Ambulatory Visit: Payer: Federal, State, Local not specified - PPO

## 2019-01-07 ENCOUNTER — Other Ambulatory Visit: Payer: Federal, State, Local not specified - PPO

## 2019-01-15 ENCOUNTER — Other Ambulatory Visit: Payer: Self-pay

## 2019-01-15 ENCOUNTER — Other Ambulatory Visit (INDEPENDENT_AMBULATORY_CARE_PROVIDER_SITE_OTHER): Payer: Federal, State, Local not specified - PPO

## 2019-01-15 DIAGNOSIS — R7989 Other specified abnormal findings of blood chemistry: Secondary | ICD-10-CM

## 2019-01-15 LAB — T3, FREE: T3, Free: 3.5 pg/mL (ref 2.3–4.2)

## 2019-01-15 LAB — TSH: TSH: 0.48 u[IU]/mL (ref 0.35–4.50)

## 2019-01-15 LAB — T4, FREE: Free T4: 0.76 ng/dL (ref 0.60–1.60)

## 2019-01-16 ENCOUNTER — Encounter: Payer: Self-pay | Admitting: Family Medicine

## 2019-01-20 ENCOUNTER — Other Ambulatory Visit: Payer: Self-pay | Admitting: Adult Health

## 2019-01-21 NOTE — Telephone Encounter (Signed)
Sent to the pharmacy by e-scribe. 

## 2019-03-06 ENCOUNTER — Ambulatory Visit (INDEPENDENT_AMBULATORY_CARE_PROVIDER_SITE_OTHER): Payer: Federal, State, Local not specified - PPO | Admitting: Adult Health

## 2019-03-06 ENCOUNTER — Other Ambulatory Visit: Payer: Self-pay

## 2019-03-06 ENCOUNTER — Encounter: Payer: Self-pay | Admitting: Adult Health

## 2019-03-06 DIAGNOSIS — J069 Acute upper respiratory infection, unspecified: Secondary | ICD-10-CM | POA: Diagnosis not present

## 2019-03-06 DIAGNOSIS — R7309 Other abnormal glucose: Secondary | ICD-10-CM | POA: Diagnosis not present

## 2019-03-06 MED ORDER — METFORMIN HCL 500 MG PO TABS: 250.0000 mg | ORAL_TABLET | Freq: Every day | ORAL | 0 refills | Status: DC

## 2019-03-06 NOTE — Progress Notes (Signed)
Virtual Visit via Video Note  I connected with Travis Rice  on 03/06/19 at  9:00 AM EDT by a video enabled telemedicine application and verified that I am speaking with the correct person using two identifiers.  Location patient: home Location provider:work or home office Persons participating in the virtual visit: patient, provider  I discussed the limitations of evaluation and management by telemedicine and the availability of in person appointments. The patient expressed understanding and agreed to proceed.   HPI: 58 year old male who is being evaluated today for concern of URI.  He reports that his symptoms have been present for 2 days, they include chills, frontal headache, frontal sinus pressure, and intermittent episodes of mild diarrhea.  He denies fevers, chills, shortness of breath, or cough.  He has not been using any over-the-counter medications.  He does report that he has been working long hours, 6 days a week.  He does wear a mask and gloves while at work.  No one in his home is sick with similar symptoms.  He also needs a refill of Metformin for glucose intolerance    ROS: See pertinent positives and negatives per HPI.  Past Medical History:  Diagnosis Date  . Abnormal glucose   . Abnormal partial thromboplastin time (PTT)   . Arthritis    right shoulder pain   . Lupus anticoagulant positive    but negative for antiphosolipid antibody and anti B2 glycoprotein antibody  . Palpitations     No past surgical history on file.  Family History  Problem Relation Age of Onset  . Hypertension Father   . Lung cancer Father   . Hypertension Mother     Current Outpatient Medications:  .  aspirin 81 MG tablet, Take 81 mg by mouth daily., Disp: , Rfl:  .  atorvastatin (LIPITOR) 20 MG tablet, TAKE 1 TABLET BY MOUTH EVERY DAY, Disp: 90 tablet, Rfl: 3 .  metFORMIN (GLUCOPHAGE) 500 MG tablet, Take 0.5 tablets (250 mg total) by mouth daily., Disp: 45 tablet, Rfl: 0 .   sildenafil (REVATIO) 20 MG tablet, Take 1-4 tabs as needed, Disp: 20 tablet, Rfl: 2  EXAM:  VITALS per patient if applicable:  GENERAL: alert, oriented, appears well and in no acute distress  HEENT: atraumatic, conjunttiva clear, no obvious abnormalities on inspection of external nose and ears  NECK: normal movements of the head and neck  LUNGS: on inspection no signs of respiratory distress, breathing rate appears normal, no obvious gross SOB, gasping or wheezing  CV: no obvious cyanosis  MS: moves all visible extremities without noticeable abnormality  PSYCH/NEURO: pleasant and cooperative, no obvious depression or anxiety, speech and thought processing grossly intact  ASSESSMENT AND PLAN:  Discussed the following assessment and plan: -Concern for COVID at this time.  Likely viral URI.  Advised to use Flonase, rest, stay hydrated.  Will provide work note via my chart. Return precautions reviewed with the patient.  Viral URI  HYPERGLYCEMIA - Plan: metFORMIN (GLUCOPHAGE) 500 MG tablet     I discussed the assessment and treatment plan with the patient. The patient was provided an opportunity to ask questions and all were answered. The patient agreed with the plan and demonstrated an understanding of the instructions.   The patient was advised to call back or seek an in-person evaluation if the symptoms worsen or if the condition fails to improve as anticipated.   Shirline Frees, NP

## 2019-05-28 ENCOUNTER — Other Ambulatory Visit: Payer: Self-pay | Admitting: Adult Health

## 2019-05-28 DIAGNOSIS — R7309 Other abnormal glucose: Secondary | ICD-10-CM

## 2019-05-29 ENCOUNTER — Other Ambulatory Visit: Payer: Self-pay | Admitting: Family Medicine

## 2019-05-29 DIAGNOSIS — R7309 Other abnormal glucose: Secondary | ICD-10-CM

## 2019-05-29 NOTE — Telephone Encounter (Signed)
NEEDS A1C AND FOLLOW UP

## 2019-05-29 NOTE — Telephone Encounter (Signed)
Left a message for pt to call back and schedule lab and virtual appt.  CRM created.

## 2019-05-30 ENCOUNTER — Encounter: Payer: Self-pay | Admitting: Family Medicine

## 2019-05-30 NOTE — Telephone Encounter (Signed)
Filled for 30 days.  Letter sent by MyChart.  Pt due for A1C and virtual visit.

## 2019-06-04 ENCOUNTER — Other Ambulatory Visit: Payer: Self-pay

## 2019-06-04 ENCOUNTER — Encounter: Payer: Self-pay | Admitting: Adult Health

## 2019-06-04 ENCOUNTER — Telehealth (INDEPENDENT_AMBULATORY_CARE_PROVIDER_SITE_OTHER): Payer: Federal, State, Local not specified - PPO | Admitting: Adult Health

## 2019-06-04 DIAGNOSIS — R202 Paresthesia of skin: Secondary | ICD-10-CM | POA: Diagnosis not present

## 2019-06-04 DIAGNOSIS — R200 Anesthesia of skin: Secondary | ICD-10-CM | POA: Diagnosis not present

## 2019-06-04 NOTE — Progress Notes (Signed)
Virtual Visit via Video Note  I connected with Travis Rice on 06/04/19 at  4:30 PM EDT by a video enabled telemedicine application and verified that I am speaking with the correct person using two identifiers.  Location patient: home Location provider:work or home office Persons participating in the virtual visit: patient, provider  I discussed the limitations of evaluation and management by telemedicine and the availability of in person appointments. The patient expressed understanding and agreed to proceed.   HPI: 58 year old male who is being evaluated today for an acute issue of left arm "numbness and tingling".  His symptoms have been present for 3 weeks.  He feels as though the numbness and tingling is significantly worse at night and at times he almost feels as though his left hand is paralyzed.  During the day he does not notice any numbness or tingling.  Reports that the fingers that are involved are the second through fourth digits  Denies numbness or tingling in his left arm, cervical spine pain, or aggravating injury  Does do a lot of repetitive motions such as typing through out the day   ROS: See pertinent positives and negatives per HPI.  Past Medical History:  Diagnosis Date  . Abnormal glucose   . Abnormal partial thromboplastin time (PTT)   . Arthritis    right shoulder pain   . Lupus anticoagulant positive    but negative for antiphosolipid antibody and anti B2 glycoprotein antibody  . Palpitations     No past surgical history on file.  Family History  Problem Relation Age of Onset  . Hypertension Father   . Lung cancer Father   . Hypertension Mother       Current Outpatient Medications:  .  aspirin 81 MG tablet, Take 81 mg by mouth daily., Disp: , Rfl:  .  atorvastatin (LIPITOR) 20 MG tablet, TAKE 1 TABLET BY MOUTH EVERY DAY, Disp: 90 tablet, Rfl: 3 .  metFORMIN (GLUCOPHAGE) 500 MG tablet, TAKE 0.5 TABLETS (250 MG TOTAL) BY MOUTH DAILY., Disp: 15  tablet, Rfl: 0 .  sildenafil (REVATIO) 20 MG tablet, Take 1-4 tabs as needed, Disp: 20 tablet, Rfl: 2  EXAM:  VITALS per patient if applicable:  GENERAL: alert, oriented, appears well and in no acute distress  HEENT: atraumatic, conjunttiva clear, no obvious abnormalities on inspection of external nose and ears  NECK: normal movements of the head and neck  LUNGS: on inspection no signs of respiratory distress, breathing rate appears normal, no obvious gross SOB, gasping or wheezing  CV: no obvious cyanosis  MS: moves all visible extremities without noticeable abnormality  PSYCH/NEURO: pleasant and cooperative, no obvious depression or anxiety, speech and thought processing grossly intact.  Had a positive Phalen's test but negative Tinels sign  ASSESSMENT AND PLAN:  Discussed the following assessment and plan:  1. Numbness and tingling in left hand -Possible beginning of carpal tunnel syndrome.  We will have him get cock up wrist splint try that for the next 2 weeks at nighttime.  If no improvement then follow-up in the office, can try prednisone therapy or referral to sports medicine.     I discussed the assessment and treatment plan with the patient. The patient was provided an opportunity to ask questions and all were answered. The patient agreed with the plan and demonstrated an understanding of the instructions.   The patient was advised to call back or seek an in-person evaluation if the symptoms worsen or if the condition fails to  improve as anticipated.   Dorothyann Peng, NP

## 2019-06-14 ENCOUNTER — Other Ambulatory Visit: Payer: Self-pay

## 2019-06-14 ENCOUNTER — Other Ambulatory Visit (INDEPENDENT_AMBULATORY_CARE_PROVIDER_SITE_OTHER): Payer: Federal, State, Local not specified - PPO

## 2019-06-14 DIAGNOSIS — R7309 Other abnormal glucose: Secondary | ICD-10-CM | POA: Diagnosis not present

## 2019-06-14 LAB — HEMOGLOBIN A1C: Hgb A1c MFr Bld: 6.2 % (ref 4.6–6.5)

## 2019-06-24 ENCOUNTER — Other Ambulatory Visit: Payer: Self-pay | Admitting: Adult Health

## 2019-06-24 DIAGNOSIS — R7309 Other abnormal glucose: Secondary | ICD-10-CM

## 2019-07-02 ENCOUNTER — Telehealth (INDEPENDENT_AMBULATORY_CARE_PROVIDER_SITE_OTHER): Payer: Federal, State, Local not specified - PPO | Admitting: Adult Health

## 2019-07-02 ENCOUNTER — Other Ambulatory Visit: Payer: Self-pay

## 2019-07-02 DIAGNOSIS — R200 Anesthesia of skin: Secondary | ICD-10-CM | POA: Diagnosis not present

## 2019-07-02 DIAGNOSIS — R7309 Other abnormal glucose: Secondary | ICD-10-CM

## 2019-07-02 DIAGNOSIS — R202 Paresthesia of skin: Secondary | ICD-10-CM | POA: Diagnosis not present

## 2019-07-02 NOTE — Progress Notes (Signed)
Virtual Visit via Video Note  I connected with Travis Rice on 07/02/19 at  1:00 PM EDT by a video enabled telemedicine application and verified that I am speaking with the correct person using two identifiers.  Location patient: home Location provider:work or home office Persons participating in the virtual visit: patient, provider  I discussed the limitations of evaluation and management by telemedicine and the availability of in person appointments. The patient expressed understanding and agreed to proceed.   HPI: 58 year old male who is being evaluated today for follow-up regarding hyperglycemia as well as possible carpal tunnel syndrome.  In February 2020 his A1c had increased to 6.5, in the past he had been bounced around in the low sixes.  The decision was made to start him on metformin 250 mg daily as a preventative measure.  He does report that he has been working on diet and cutting back on beer to help lower his blood sugars.  Today his A1c is 6.2.   This time last month I saw him via telemedicine for numbness and tingling in his left hand.  This was especially apparent at night and with overuse.  He was advised to start using a cock up splint, which he has and he has noticed significant improvement in the numbness and tingling.     ROS: See pertinent positives and negatives per HPI.  Past Medical History:  Diagnosis Date  . Abnormal glucose   . Abnormal partial thromboplastin time (PTT)   . Arthritis    right shoulder pain   . Lupus anticoagulant positive    but negative for antiphosolipid antibody and anti B2 glycoprotein antibody  . Palpitations     No past surgical history on file.  Family History  Problem Relation Age of Onset  . Hypertension Father   . Lung cancer Father   . Hypertension Mother      Current Outpatient Medications:  .  aspirin 81 MG tablet, Take 81 mg by mouth daily., Disp: , Rfl:  .  atorvastatin (LIPITOR) 20 MG tablet, TAKE 1 TABLET BY  MOUTH EVERY DAY, Disp: 90 tablet, Rfl: 3 .  metFORMIN (GLUCOPHAGE) 500 MG tablet, Take 0.5 tablets (250 mg total) by mouth daily., Disp: 45 tablet, Rfl: 0 .  sildenafil (REVATIO) 20 MG tablet, Take 1-4 tabs as needed, Disp: 20 tablet, Rfl: 2  EXAM:  VITALS per patient if applicable:  GENERAL: alert, oriented, appears well and in no acute distress  HEENT: atraumatic, conjunttiva clear, no obvious abnormalities on inspection of external nose and ears  NECK: normal movements of the head and neck  LUNGS: on inspection no signs of respiratory distress, breathing rate appears normal, no obvious gross SOB, gasping or wheezing  CV: no obvious cyanosis  MS: moves all visible extremities without noticeable abnormality  PSYCH/NEURO: pleasant and cooperative, no obvious depression or anxiety, speech and thought processing grossly intact  ASSESSMENT AND PLAN:  Discussed the following assessment and plan:  1. HYPERGLYCEMIA - A1c has improved. Continue with metformin 250 mg daily.  Encouraged to continue with lifestyle modifications.  We will follow-up at his physical exam in February  2. Numbness and tingling in left hand - Continue with cock up splint       I discussed the assessment and treatment plan with the patient. The patient was provided an opportunity to ask questions and all were answered. The patient agreed with the plan and demonstrated an understanding of the instructions.   The patient was advised to call  back or seek an in-person evaluation if the symptoms worsen or if the condition fails to improve as anticipated.   Dorothyann Peng, NP

## 2019-07-26 ENCOUNTER — Encounter: Payer: Self-pay | Admitting: Gastroenterology

## 2019-08-26 ENCOUNTER — Encounter: Payer: Self-pay | Admitting: Family Medicine

## 2019-08-26 ENCOUNTER — Ambulatory Visit (INDEPENDENT_AMBULATORY_CARE_PROVIDER_SITE_OTHER): Payer: Federal, State, Local not specified - PPO

## 2019-08-26 ENCOUNTER — Ambulatory Visit: Payer: Federal, State, Local not specified - PPO | Admitting: Family Medicine

## 2019-08-26 ENCOUNTER — Other Ambulatory Visit: Payer: Self-pay

## 2019-08-26 VITALS — BP 120/82 | HR 74 | Temp 98.1°F | Ht 68.0 in | Wt 173.1 lb

## 2019-08-26 DIAGNOSIS — S8992XA Unspecified injury of left lower leg, initial encounter: Secondary | ICD-10-CM | POA: Diagnosis not present

## 2019-08-26 DIAGNOSIS — G8929 Other chronic pain: Secondary | ICD-10-CM

## 2019-08-26 DIAGNOSIS — M25562 Pain in left knee: Secondary | ICD-10-CM

## 2019-08-26 DIAGNOSIS — M25511 Pain in right shoulder: Secondary | ICD-10-CM

## 2019-08-26 MED ORDER — PREDNISONE 20 MG PO TABS: ORAL_TABLET | ORAL | 0 refills | Status: DC

## 2019-08-26 MED ORDER — HYDROCODONE-ACETAMINOPHEN 5-325 MG PO TABS: 1.0000 | ORAL_TABLET | Freq: Every evening | ORAL | 0 refills | Status: DC | PRN

## 2019-08-26 NOTE — Progress Notes (Signed)
Travis Rice. DOB: 1961/09/02 Encounter date: 08/26/2019  This is a 58 y.o. male who presents with Chief Complaint  Patient presents with  . Knee Pain    patient complains of left knee pain, swelling and knee gives away x1 week, states he had a knee scope 10-15 years, hit inside of work truck (job as a Development worker, community carrier) 1 week ago, works 12+hours shifts lately     History of present illness: Was getting in truck- hit mail bin on knee. 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 Has been working through pain. States it is 5/10; but getting harder with long shifts.   Started taking aspirin. Didn't want it to start clotting. Pain radiating down calf and into foot.   Right shoulder also bothering him with repetative movement. Hard to sleep - can't lay on right shoulder. Waking anyway to urinate q 4 hours.   Just getting worn down.   Was told that down the line he might need knee replacement.   First knee injury was about a week ago. Still hurts, not as bad. Repetative movement aggravates/inflames it. Using advil to try and help with pain. Voltaren gel as well (helps more with shoulder pain). Takes 2 advil just once a day.   In past there was not injury - thinks there was cartilage issue.  Allergies  Allergen Reactions  . Zegerid [Omeprazole-Sodium Bicarbonate] Anaphylaxis    Rash, lip and hand swelling, itching , throat swelling   Current Meds  Medication Sig  . aspirin 81 MG tablet Take 81 mg by mouth daily.  Marland Kitchen atorvastatin (LIPITOR) 20 MG tablet TAKE 1 TABLET BY MOUTH EVERY DAY  . metFORMIN (GLUCOPHAGE) 500 MG tablet Take 0.5 tablets (250 mg total) by mouth daily.  . sildenafil (REVATIO) 20 MG tablet Take 1-4 tabs as needed    Review of Systems  Constitutional: Negative for chills, fatigue and fever.  Respiratory: Negative for cough, chest tightness, shortness of breath and wheezing.   Cardiovascular: Negative for chest pain, palpitations and leg swelling.   Musculoskeletal: Positive for arthralgias (see hpi).    Objective:  BP 120/82 (BP Location: Right Arm, Patient Position: Sitting, Cuff Size: Normal)   Pulse 74   Temp 98.1 F (36.7 C) (Temporal)   Ht 5\' 8"  (1.727 m)   Wt 173 lb 1.6 oz (78.5 kg)   SpO2 98%   BMI 26.32 kg/m   Weight: 173 lb 1.6 oz (78.5 kg)   BP Readings from Last 3 Encounters:  08/26/19 120/82  12/12/18 118/76  08/29/18 130/80   Wt Readings from Last 3 Encounters:  08/26/19 173 lb 1.6 oz (78.5 kg)  12/12/18 177 lb (80.3 kg)  08/29/18 172 lb 3.2 oz (78.1 kg)    Physical Exam Constitutional:      General: He is not in acute distress.    Appearance: He is well-developed.  Cardiovascular:     Rate and Rhythm: Normal rate and regular rhythm.     Heart sounds: Normal heart sounds. No murmur. No friction rub.  Pulmonary:     Effort: Pulmonary effort is normal. No respiratory distress.     Breath sounds: Normal breath sounds. No wheezing or rales.  Musculoskeletal:     Right lower leg: No edema.     Left lower leg: No edema.     Comments: He has limited abduction of his right shoulder to about 100 degrees.  He does have a positive Hawkins.  No loss of strength with rotator  cuff testing.  There is some lateral to anterior tenderness with palpation.  Full range of motion of left knee although he does have some apprehension with full extension.  There is some resolving ecchymosis subpatellar.  A very small effusion is palpated.  There is tenderness with palpation of the patellar tendon.  He also has lateral joint line tenderness which is more significant than medial.  He does not have any laxity noted on exam.  No pain with lateral or medial pressure on the knee.  No pain with pressure on meniscus.  Neurological:     Mental Status: He is alert and oriented to person, place, and time.  Psychiatric:        Behavior: Behavior normal.     Assessment/Plan  1. Left knee pain Trial of steroids to try to take down  inflammation about the shoulder and the knee.  We discussed that this may elevate his blood sugars and he should follow a low carbohydrate diet while taking.  He will let me know if not getting some improvement with this.  He does already have a follow-up visit scheduled with his primary so he can be rechecked at that time.  I did limit his work hours since this does seem to flareup his knee pain.  I also kept him out of work this week so he can rest, ice, and work on range of motion exercises with his knee. - predniSONE (DELTASONE) 20 MG tablet; Take 3 tabs daily x 3 days, then 2 tabs daily x 3 days, then 1 tab daily x 2 days, then 1/2 tab daily x 2 days  Dispense: 18 tablet; Refill: 0 - DG Knee Complete 4 Views Left; Future - DG Knee Complete 4 Views Left - HYDROcodone-acetaminophen (NORCO/VICODIN) 5-325 MG tablet; Take 1 tablet by mouth at bedtime as needed for severe pain.  Dispense: 10 tablet; Refill: 0  2. Chronic right shoulder pain See above.  If not getting some improvement with prednisone he does have a follow-up already scheduled.    Return if symptoms worsen or fail to improve.    Theodis Shove, MD

## 2019-09-10 ENCOUNTER — Encounter: Payer: Self-pay | Admitting: Adult Health

## 2019-09-10 ENCOUNTER — Ambulatory Visit: Payer: Federal, State, Local not specified - PPO | Admitting: Adult Health

## 2019-09-10 ENCOUNTER — Other Ambulatory Visit: Payer: Self-pay

## 2019-09-10 VITALS — BP 140/90 | Temp 98.1°F | Wt 177.0 lb

## 2019-09-10 DIAGNOSIS — M25562 Pain in left knee: Secondary | ICD-10-CM

## 2019-09-10 MED ORDER — METHYLPREDNISOLONE ACETATE 80 MG/ML IJ SUSP
80.0000 mg | Freq: Once | INTRAMUSCULAR | Status: AC
  Administered 2019-09-10: 80 mg via INTRA_ARTICULAR

## 2019-09-10 NOTE — Progress Notes (Signed)
Subjective:    Patient ID: Travis Rice., male    DOB: 12/18/1960, 58 y.o.   MRN: [id number]  HPI 58 year old male who  has a past medical history of Abnormal glucose, Abnormal partial thromboplastin time (PTT), Arthritis, Lupus anticoagulant positive, and Palpitations.  He follows up for left knee pain. He was seen by another provider on 08/26/2019 for left knee pain and chronic right shoulder pain. He was prescribed a prednisone taper and Norco. He reports that his shoulder is feeling much better but his knee continues to be painful with movements and weight bearing. He works as a Dispensing optician.  Xray showed   Minimal degenerative changes of the patellofemoral joint. No evidence of acute fracture or dislocation. No significant joint effusion.  Review of Systems See HPI   Past Medical History:  Diagnosis Date  . Abnormal glucose   . Abnormal partial thromboplastin time (PTT)   . Arthritis    right shoulder pain   . Lupus anticoagulant positive    but negative for antiphosolipid antibody and anti B2 glycoprotein antibody  . Palpitations     Social History   Socioeconomic History  . Marital status: Married    Spouse name: Not on file  . Number of children: Not on file  . Years of education: Not on file  . Highest education level: Not on file  Occupational History  . Not on file  Social Needs  . Financial resource strain: Not on file  . Food insecurity    Worry: Not on file    Inability: Not on file  . Transportation needs    Medical: Not on file    Non-medical: Not on file  Tobacco Use  . Smoking status: Current Some Day Smoker    Packs/day: 0.25    Types: Cigarettes    Last attempt to quit: 05/24/2012    Years since quitting: 7.3  . Smokeless tobacco: Never Used  . Tobacco comment: trying to quit  Substance and Sexual Activity  . Alcohol use: Yes    Alcohol/week: 0.0 standard drinks    Comment: occ  . Drug use: No  . Sexual activity: Not on file   Lifestyle  . Physical activity    Days per week: Not on file    Minutes per session: Not on file  . Stress: Not on file  Relationships  . Social Herbalist on phone: Not on file    Gets together: Not on file    Attends religious service: Not on file    Active member of club or organization: Not on file    Attends meetings of clubs or organizations: Not on file    Relationship status: Not on file  . Intimate partner violence    Fear of current or ex partner: Not on file    Emotionally abused: Not on file    Physically abused: Not on file    Forced sexual activity: Not on file  Other Topics Concern  . Not on file  Social History Narrative  . Not on file    Past Surgical History:  Procedure Laterality Date  . left knee scope      Family History  Problem Relation Age of Onset  . Hypertension Father   . Lung cancer Father   . Hypertension Mother     Allergies  Allergen Reactions  . Zegerid [Omeprazole-Sodium Bicarbonate] Anaphylaxis    Rash, lip and hand swelling, itching , throat swelling  Current Outpatient Medications on File Prior to Visit  Medication Sig Dispense Refill  . aspirin 81 MG tablet Take 81 mg by mouth daily.    Marland Kitchen atorvastatin (LIPITOR) 20 MG tablet TAKE 1 TABLET BY MOUTH EVERY DAY 90 tablet 3  . metFORMIN (GLUCOPHAGE) 500 MG tablet Take 0.5 tablets (250 mg total) by mouth daily. 45 tablet 0  . sildenafil (REVATIO) 20 MG tablet Take 1-4 tabs as needed 20 tablet 2   No current facility-administered medications on file prior to visit.     BP 140/90   Temp 98.1 F (36.7 C)   Wt 177 lb (80.3 kg)   BMI 26.91 kg/m       Objective:   Physical Exam Vitals signs and nursing note reviewed.  Constitutional:      Appearance: Normal appearance.  Cardiovascular:     Rate and Rhythm: Regular rhythm.  Musculoskeletal:     Comments: He continues to have palpation fo the patellar tendon and lateral joint line tenderness. No pain with lateral  or medial pressure on the knee. No pain with pressure to meniscus    Skin:    General: Skin is warm and dry.     Capillary Refill: Capillary refill takes less than 2 seconds.  Neurological:     General: No focal deficit present.     Mental Status: He is alert and oriented to person, place, and time.  Psychiatric:        Behavior: Behavior normal.        Thought Content: Thought content normal.        Judgment: Judgment normal.       Assessment & Plan:  1. Acute pain of left knee - we discussed various treatment and he would like to try a steroid injection. He has responded well to this in the past.  Discussed risks and benefits of corticosteroid injection and patient consented.  After prepping skin with betadine, injected 80 mg depomedrol and 2 cc of plain xylocaine with 22 gauge one and one half inch needle using anterolateral approach and pt tolerated well. - methylPREDNISolone acetate (DEPO-MEDROL) injection 80 mg - Consider referral to orthopedics if no improvement  - Follow up in one month or sooner if needed  Shirline Frees, NP

## 2019-10-09 ENCOUNTER — Other Ambulatory Visit: Payer: Self-pay

## 2019-10-10 ENCOUNTER — Encounter: Payer: Self-pay | Admitting: Adult Health

## 2019-10-10 ENCOUNTER — Ambulatory Visit: Payer: Federal, State, Local not specified - PPO | Admitting: Adult Health

## 2019-10-10 VITALS — BP 142/82 | Temp 97.9°F | Wt 179.0 lb

## 2019-10-10 DIAGNOSIS — M25562 Pain in left knee: Secondary | ICD-10-CM

## 2019-10-10 DIAGNOSIS — G8929 Other chronic pain: Secondary | ICD-10-CM

## 2019-10-10 NOTE — Progress Notes (Signed)
Subjective:    Patient ID: Travis Rice., male    DOB: March 28, 1961, 58 y.o.   MRN: [id number]  HPI 58 year old male who  has a past medical history of Abnormal glucose, Abnormal partial thromboplastin time (PTT), Arthritis, Lupus anticoagulant positive, and Palpitations.  He presents to the office today for follow up regarding left knee pain. One month ago he was given a prednisone injection and reports that he did not notice much improvement. He continues to have discomfort, especially after working much of the day on his feet. No loss of ROM  He would like to see orthopedics to see if they can do anything for him.   Review of Systems  Constitutional: Negative.   Respiratory: Negative.   Gastrointestinal: Negative.   Genitourinary: Negative.   Musculoskeletal: Positive for arthralgias. Negative for gait problem and joint swelling.  Skin: Negative.   Psychiatric/Behavioral: Negative.   All other systems reviewed and are negative.  Past Medical History:  Diagnosis Date  . Abnormal glucose   . Abnormal partial thromboplastin time (PTT)   . Arthritis    right shoulder pain   . Lupus anticoagulant positive    but negative for antiphosolipid antibody and anti B2 glycoprotein antibody  . Palpitations     Social History   Socioeconomic History  . Marital status: Married    Spouse name: Not on file  . Number of children: Not on file  . Years of education: Not on file  . Highest education level: Not on file  Occupational History  . Not on file  Tobacco Use  . Smoking status: Current Some Day Smoker    Packs/day: 0.25    Types: Cigarettes    Last attempt to quit: 05/24/2012    Years since quitting: 7.3  . Smokeless tobacco: Never Used  . Tobacco comment: trying to quit  Substance and Sexual Activity  . Alcohol use: Yes    Alcohol/week: 0.0 standard drinks    Comment: occ  . Drug use: No  . Sexual activity: Not on file  Other Topics Concern  . Not on file  Social  History Narrative  . Not on file   Social Determinants of Health   Financial Resource Strain:   . Difficulty of Paying Living Expenses: Not on file  Food Insecurity:   . Worried About Charity fundraiser in the Last Year: Not on file  . Ran Out of Food in the Last Year: Not on file  Transportation Needs:   . Lack of Transportation (Medical): Not on file  . Lack of Transportation (Non-Medical): Not on file  Physical Activity:   . Days of Exercise per Week: Not on file  . Minutes of Exercise per Session: Not on file  Stress:   . Feeling of Stress : Not on file  Social Connections:   . Frequency of Communication with Friends and Family: Not on file  . Frequency of Social Gatherings with Friends and Family: Not on file  . Attends Religious Services: Not on file  . Active Member of Clubs or Organizations: Not on file  . Attends Archivist Meetings: Not on file  . Marital Status: Not on file  Intimate Partner Violence:   . Fear of Current or Ex-Partner: Not on file  . Emotionally Abused: Not on file  . Physically Abused: Not on file  . Sexually Abused: Not on file    Past Surgical History:  Procedure Laterality Date  . left  knee scope      Family History  Problem Relation Age of Onset  . Hypertension Father   . Lung cancer Father   . Hypertension Mother     Allergies  Allergen Reactions  . Zegerid [Omeprazole-Sodium Bicarbonate] Anaphylaxis    Rash, lip and hand swelling, itching , throat swelling    Current Outpatient Medications on File Prior to Visit  Medication Sig Dispense Refill  . aspirin 81 MG tablet Take 81 mg by mouth daily.    Marland Kitchen atorvastatin (LIPITOR) 20 MG tablet TAKE 1 TABLET BY MOUTH EVERY DAY 90 tablet 3  . sildenafil (REVATIO) 20 MG tablet Take 1-4 tabs as needed 20 tablet 2  . metFORMIN (GLUCOPHAGE) 500 MG tablet Take 0.5 tablets (250 mg total) by mouth daily. 45 tablet 0   No current facility-administered medications on file prior to  visit.    BP (!) 142/82   Temp 97.9 F (36.6 C)   Wt 179 lb (81.2 kg)   BMI 27.22 kg/m       Objective:   Physical Exam Vitals and nursing note reviewed.  Constitutional:      Appearance: Normal appearance.  Musculoskeletal:        General: Tenderness present. No swelling. Normal range of motion.     Left knee: Bony tenderness present. No swelling, deformity, erythema, ecchymosis or crepitus. Normal range of motion.     Right lower leg: No edema.     Left lower leg: Normal.  Skin:    General: Skin is warm and dry.     Capillary Refill: Capillary refill takes less than 2 seconds.  Neurological:     General: No focal deficit present.     Mental Status: He is alert and oriented to person, place, and time.       Assessment & Plan:  1. Chronic pain of left knee - AMB referral to orthopedics - ice, rest, and NSAIDS as needed  Shirline Frees, NP

## 2019-10-11 ENCOUNTER — Other Ambulatory Visit: Payer: Self-pay | Admitting: Adult Health

## 2019-10-11 DIAGNOSIS — R7309 Other abnormal glucose: Secondary | ICD-10-CM

## 2019-10-11 NOTE — Telephone Encounter (Signed)
Sent to the pharmacy by e-scribe. 

## 2019-10-11 NOTE — Telephone Encounter (Signed)
Ok to refill 

## 2019-11-05 ENCOUNTER — Ambulatory Visit: Payer: Federal, State, Local not specified - PPO | Admitting: Orthopaedic Surgery

## 2019-11-13 ENCOUNTER — Encounter: Payer: Self-pay | Admitting: Orthopaedic Surgery

## 2019-11-13 ENCOUNTER — Ambulatory Visit: Payer: Federal, State, Local not specified - PPO | Admitting: Orthopaedic Surgery

## 2019-11-13 ENCOUNTER — Other Ambulatory Visit: Payer: Self-pay

## 2019-11-13 VITALS — Ht 68.0 in | Wt 180.0 lb

## 2019-11-13 DIAGNOSIS — M25562 Pain in left knee: Secondary | ICD-10-CM | POA: Diagnosis not present

## 2019-11-13 DIAGNOSIS — G8929 Other chronic pain: Secondary | ICD-10-CM

## 2019-11-13 NOTE — Progress Notes (Signed)
Office Visit Note   Patient: Travis Rice.           Date of Birth: Aug 13, 1961           MRN: [id number] Visit Date: 11/13/2019              Requested by: Shirline Frees, NP [address] [place],  [place] [address] PCP: Shirline Frees, NP   Assessment & Plan: Visit Diagnoses:  1. 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  Recently had a cortisone injection through his primary care physician without much relief.  Given the chronicity of his pain and compromise of his activities I will order an MRI scan  Follow-Up Instructions: Return After MRI scan left knee.   Orders:  Orders Placed This Encounter  Procedures  . MR Knee Left w/o contrast   No orders of the defined types were placed in this encounter.     Procedures: No procedures performed   Clinical Data: No additional findings.   Subjective: Chief Complaint  Patient presents with  . Left Knee - Pain  Patient presents today for left knee pain. 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 His pain is all throughout his knee. The pain is a level 5 in the morning and increases throughout the day. He initially was taking Ibuprofen, but not consistently now. Occasionally swelling. No grinding.  Patient has a history of knee arthroscopy 15years ago. He saw his PCP in November and received a cortisone injection, but states that it did not help at all. His PCP also put him working no overtime. He had x-rays taken in November of 2020.  I reviewed the films on the PACS system.  The joint spaces are well-maintained.  There was no subchondral sclerosis.  Mild degenerative changes about the patella but seem to track in the  Midline.  Onset of pain seem to correlate with his working extra hours as a mail carrier and now finds it very difficult to work over 8 hours related to his knee pain.   Pain started off is about a 5 in October without injury or trauma and has progressed to where it is consistently is 7 HPI  Review of Systems   Objective: Vital Signs: Ht 5\' 8"  (1.727 m)   Wt 180 lb (81.6 kg)   BMI 27.37 kg/m   Physical Exam Constitutional:      Appearance: He is well-developed.  Eyes:     Pupils: Pupils are equal, round, and reactive to light.  Pulmonary:     Effort: Pulmonary effort is normal.  Skin:    General: Skin is warm and dry.  Neurological:     Mental Status: He is alert and oriented to person, place, and time.  Psychiatric:        Behavior: Behavior normal.     Ortho Exam left knee was not hot red warm or swollen.  Had some pain with full extension beneath the patella and along the patella tendon.  No masses.  Some pain along the posterior half of the medial joint.  No popping or clicking.  Did have positive grinding with patella motion.  No lateral joint pain.  No mid popliteal mass.  No calf pain no distal edema.  Straight leg raise negative.  No evidence of instability.  Negative anterior  drawer sign  Specialty Comments:  No specialty comments available.  Imaging: No results found.   PMFS History: Patient Active Problem List   Diagnosis Date Noted  . Right shoulder pain 07/21/2015  . Pain in left knee 01/11/2013  . Preventative health care 12/07/2011  . HYPERGLYCEMIA 06/18/2009  . PRIMARY HYPERCOAGULABLE STATE 07/09/2008  . TOBACCO ABUSE 07/09/2008  . Coronary atherosclerosis 07/09/2008   Past Medical History:  Diagnosis Date  . Abnormal glucose   . Abnormal partial thromboplastin time (PTT)   . Arthritis    right shoulder pain   . Lupus anticoagulant positive    but negative for antiphosolipid antibody and anti B2 glycoprotein antibody  . Palpitations     Family History  Problem Relation Age of Onset  . Hypertension Father   . Lung cancer Father   . Hypertension Mother     Past Surgical History:  Procedure Laterality Date   . left knee scope     Social History   Occupational History  . Not on file  Tobacco Use  . Smoking status: Current Some Day Smoker    Packs/day: 0.25    Types: Cigarettes    Last attempt to quit: 05/24/2012    Years since quitting: 7.4  . Smokeless tobacco: Never Used  . Tobacco comment: trying to quit  Substance and Sexual Activity  . Alcohol use: Yes    Alcohol/week: 0.0 standard drinks    Comment: occ  . Drug use: No  . Sexual activity: Not on file

## 2019-12-17 ENCOUNTER — Ambulatory Visit
Admission: RE | Admit: 2019-12-17 | Discharge: 2019-12-17 | Disposition: A | Discharge status: Home / Self Care | Payer: Federal, State, Local not specified - PPO | Source: Ambulatory Visit | Attending: Orthopaedic Surgery | Admitting: Orthopaedic Surgery

## 2019-12-17 ENCOUNTER — Other Ambulatory Visit: Payer: Self-pay

## 2019-12-17 DIAGNOSIS — M25562 Pain in left knee: Secondary | ICD-10-CM | POA: Diagnosis not present

## 2019-12-17 DIAGNOSIS — G8929 Other chronic pain: Secondary | ICD-10-CM

## 2019-12-23 ENCOUNTER — Other Ambulatory Visit: Payer: Self-pay | Admitting: Adult Health

## 2019-12-24 ENCOUNTER — Ambulatory Visit: Payer: Federal, State, Local not specified - PPO | Admitting: Orthopaedic Surgery

## 2019-12-24 NOTE — Telephone Encounter (Signed)
Sent to the pharmacy by e-scribe.  Pt has upcoming cpx. 

## 2019-12-25 ENCOUNTER — Other Ambulatory Visit: Payer: Self-pay

## 2019-12-25 ENCOUNTER — Ambulatory Visit: Payer: Federal, State, Local not specified - PPO | Admitting: Orthopaedic Surgery

## 2019-12-25 ENCOUNTER — Encounter: Payer: Self-pay | Admitting: Orthopaedic Surgery

## 2019-12-25 VITALS — Ht 68.0 in | Wt 180.0 lb

## 2019-12-25 DIAGNOSIS — M25562 Pain in left knee: Secondary | ICD-10-CM

## 2019-12-25 DIAGNOSIS — G8929 Other chronic pain: Secondary | ICD-10-CM | POA: Diagnosis not present

## 2019-12-25 NOTE — Progress Notes (Signed)
Office Visit Note   Patient: Travis Rice.           Date of Birth: 07-22-1961           MRN: [id number] Visit Date: 12/25/2019              Requested by: Dorothyann Peng, NP [place],  [place] [address] PCP: Dorothyann Peng, NP   Assessment & Plan: Visit Diagnoses:  1. Chronic pain of left knee     Plan: MRI scan demonstrates a tear of the medial meniscus at the root and posterior horn.  Presently not been having much trouble nor compromise of his activities.  Had prior knee arthroscopy about 20 years ago which could have involved a partial medial meniscectomy.  There were minimal degenerative changes.  Long discussion regarding the scan of what he may experience over time.  As long as he is comfortable I would not suggest arthroscopy.  Fortunately he has minimal degenerative symptoms.  Have talked about using anti-inflammatory medicines and a knee support if necessary  Follow-Up Instructions: Return if symptoms worsen or fail to improve.   Orders:  No orders of the defined types were placed in this encounter.  No orders of the defined types were placed in this encounter.     Procedures: No procedures performed   Clinical Data: No additional findings.   Subjective: Chief Complaint  Patient presents with  . Left Knee - Follow-up    MRI results  Patient presents today for follow up on his left knee. He had an MRI on 12/17/2019 and is here today to discuss those results. He states that there has been no changes since his last visit. He does not take anything for pain.   HPI  Review of Systems   Objective: Vital Signs: Ht 5\' 8"  (1.727 m)   Wt 180 lb (81.6 kg)   BMI 27.37 kg/m   Physical Exam Constitutional:      Appearance: He is well-developed.  Eyes:     Pupils: Pupils are equal, round, and reactive to light.  Pulmonary:     Effort: Pulmonary effort is normal.  Skin:    General: Skin is warm and dry.  Neurological:     Mental  Status: He is alert and oriented to person, place, and time.  Psychiatric:        Behavior: Behavior normal.     Ortho Exam left knee was not hot red warm or swollen.  No effusion.  No significant medial joint pain.  No patella discomfort.  Full extension and flexed and well over 105 degrees without instability.  No popliteal pain or mass.  No calf pain.  Straight leg raise negative.  Painless range of motion left hip  Specialty Comments:  No specialty comments available.  Imaging: No results found.   PMFS History: Patient Active Problem List   Diagnosis Date Noted  . Right shoulder pain 07/21/2015  . Pain in left knee 01/11/2013  . Preventative health care 12/07/2011  . HYPERGLYCEMIA 06/18/2009  . PRIMARY HYPERCOAGULABLE STATE 07/09/2008  . TOBACCO ABUSE 07/09/2008  . Coronary atherosclerosis 07/09/2008   Past Medical History:  Diagnosis Date  . Abnormal glucose   . Abnormal partial thromboplastin time (PTT)   . Arthritis    right shoulder pain   . Lupus anticoagulant positive    but negative for antiphosolipid antibody and anti B2 glycoprotein antibody  . Palpitations     Family History  Problem Relation  Age of Onset  . Hypertension Father   . Lung cancer Father   . Hypertension Mother     Past Surgical History:  Procedure Laterality Date  . left knee scope     Social History   Occupational History  . Not on file  Tobacco Use  . Smoking status: Current Some Day Smoker    Packs/day: 0.25    Types: Cigarettes    Last attempt to quit: 05/24/2012    Years since quitting: 7.5  . Smokeless tobacco: Never Used  . Tobacco comment: trying to quit  Substance and Sexual Activity  . Alcohol use: Yes    Alcohol/week: 0.0 standard drinks    Comment: occ  . Drug use: No  . Sexual activity: Not on file

## 2020-01-02 ENCOUNTER — Ambulatory Visit: Payer: Federal, State, Local not specified - PPO | Attending: Internal Medicine

## 2020-01-02 DIAGNOSIS — Z23 Encounter for immunization: Secondary | ICD-10-CM

## 2020-01-02 NOTE — Progress Notes (Signed)
   [facility]  Name:  Travis Rice.    MRN: [id number] DOB: 1961-04-09  01/02/2020  Mr. Travis Rice was observed post Covid-19 immunization for 15 minutes without incident. He was provided with Vaccine Information Sheet and instruction to access the V-Safe system.   Mr. Travis Rice was instructed to call 911 with any severe reactions post vaccine: Marland Kitchen Difficulty breathing  . Swelling of face and throat  . A fast heartbeat  . A bad rash all over body  . Dizziness and weakness   Immunizations Administered    Name Date Dose VIS Date Route   Moderna COVID-19 Vaccine 01/02/2020 11:20 AM 0.5 mL 09/24/2019 Intramuscular   Manufacturer: Moderna   Lot: [id number]   NDC: 38887-579-72

## 2020-01-03 ENCOUNTER — Ambulatory Visit: Payer: Federal, State, Local not specified - PPO

## 2020-01-09 ENCOUNTER — Other Ambulatory Visit: Payer: Self-pay

## 2020-01-10 ENCOUNTER — Ambulatory Visit (INDEPENDENT_AMBULATORY_CARE_PROVIDER_SITE_OTHER): Payer: Federal, State, Local not specified - PPO | Admitting: Adult Health

## 2020-01-10 ENCOUNTER — Other Ambulatory Visit: Payer: Self-pay | Admitting: Adult Health

## 2020-01-10 ENCOUNTER — Encounter: Payer: Self-pay | Admitting: Adult Health

## 2020-01-10 VITALS — BP 132/82 | Temp 98.0°F | Ht 68.0 in | Wt 180.0 lb

## 2020-01-10 DIAGNOSIS — E7439 Other disorders of intestinal carbohydrate absorption: Secondary | ICD-10-CM | POA: Diagnosis not present

## 2020-01-10 DIAGNOSIS — N529 Male erectile dysfunction, unspecified: Secondary | ICD-10-CM

## 2020-01-10 DIAGNOSIS — F17200 Nicotine dependence, unspecified, uncomplicated: Secondary | ICD-10-CM

## 2020-01-10 DIAGNOSIS — Z125 Encounter for screening for malignant neoplasm of prostate: Secondary | ICD-10-CM

## 2020-01-10 DIAGNOSIS — Z0000 Encounter for general adult medical examination without abnormal findings: Secondary | ICD-10-CM | POA: Diagnosis not present

## 2020-01-10 DIAGNOSIS — R7309 Other abnormal glucose: Secondary | ICD-10-CM

## 2020-01-10 LAB — CBC WITH DIFFERENTIAL/PLATELET
Basophils Absolute: 0 10*3/uL (ref 0.0–0.1)
Basophils Relative: 0.5 % (ref 0.0–3.0)
Eosinophils Absolute: 0.3 10*3/uL (ref 0.0–0.7)
Eosinophils Relative: 4.1 % (ref 0.0–5.0)
HCT: 42.4 % (ref 39.0–52.0)
Hemoglobin: 14.4 g/dL (ref 13.0–17.0)
Lymphocytes Relative: 30 % (ref 12.0–46.0)
Lymphs Abs: 2.2 10*3/uL (ref 0.7–4.0)
MCHC: 34 g/dL (ref 30.0–36.0)
MCV: 92.9 fl (ref 78.0–100.0)
Monocytes Absolute: 0.8 10*3/uL (ref 0.1–1.0)
Monocytes Relative: 11.2 % (ref 3.0–12.0)
Neutro Abs: 4 10*3/uL (ref 1.4–7.7)
Neutrophils Relative %: 54.2 % (ref 43.0–77.0)
Platelets: 371 10*3/uL (ref 150.0–400.0)
RBC: 4.56 Mil/uL (ref 4.22–5.81)
RDW: 13.4 % (ref 11.5–15.5)
WBC: 7.4 10*3/uL (ref 4.0–10.5)

## 2020-01-10 LAB — COMPREHENSIVE METABOLIC PANEL
ALT: 29 U/L (ref 0–53)
AST: 23 U/L (ref 0–37)
Albumin: 4.2 g/dL (ref 3.5–5.2)
Alkaline Phosphatase: 65 U/L (ref 39–117)
BUN: 17 mg/dL (ref 6–23)
CO2: 25 mEq/L (ref 19–32)
Calcium: 9.7 mg/dL (ref 8.4–10.5)
Chloride: 109 mEq/L (ref 96–112)
Creatinine, Ser: 1.1 mg/dL (ref 0.40–1.50)
GFR: 82.32 mL/min (ref >60.00)
Glucose, Bld: 98 mg/dL (ref 70–99)
Potassium: 4.6 mEq/L (ref 3.5–5.1)
Sodium: 143 mEq/L (ref 135–145)
Total Bilirubin: 1.2 mg/dL (ref 0.2–1.2)
Total Protein: 6.8 g/dL (ref 6.0–8.3)

## 2020-01-10 LAB — TSH: TSH: 0.52 u[IU]/mL (ref 0.35–4.50)

## 2020-01-10 LAB — PSA: PSA: 0.21 ng/mL (ref 0.10–4.00)

## 2020-01-10 LAB — LIPID PANEL
Cholesterol: 195 mg/dL (ref 0–200)
HDL: 93.6 mg/dL (ref >39.00)
LDL Cholesterol: 85 mg/dL (ref 0–99)
NonHDL: 101.07
Total CHOL/HDL Ratio: 2
Triglycerides: 82 mg/dL (ref 0.0–149.0)
VLDL: 16.4 mg/dL (ref 0.0–40.0)

## 2020-01-10 LAB — HEMOGLOBIN A1C: Hgb A1c MFr Bld: 6.3 % (ref 4.6–6.5)

## 2020-01-10 MED ORDER — SILDENAFIL CITRATE 50 MG PO TABS: 50.0000 mg | ORAL_TABLET | Freq: Every day | ORAL | 2 refills | Status: DC | PRN

## 2020-01-10 NOTE — Progress Notes (Signed)
Subjective:    Patient ID: Travis Rice., male    DOB: 05/12/1961, 59 y.o.   MRN: [id number]  HPI  Patient presents for yearly preventative medicine examination. He is a pleasant 59 year old male who  has a past medical history of Abnormal glucose, Abnormal partial thromboplastin time (PTT), Arthritis, Lupus anticoagulant positive, and Palpitations.  Hyperlipidemia - takes lipitor 20 mg and ASA 81 mg. He denies myalgia or fatigue  Lab Results  Component Value Date   CHOL 159 12/12/2018   HDL 70.50 12/12/2018   LDLCALC 72 12/12/2018   LDLDIRECT 144.4 01/30/2013   TRIG 81.0 12/12/2018   CHOLHDL 2 12/12/2018   Glucose Intolerance/Pre diabetes - currently prescribed Metformin 250 mg daily.  Lab Results  Component Value Date   HGBA1C 6.2 06/14/2019    ED - takes viagra as needed.He has trouble maintaining an erection  Tobacco Use - he continues to smoke about 1-2 cigarettes per day.   All immunizations and health maintenance protocols were reviewed with the patient and needed orders were placed.  Appropriate screening laboratory values were ordered for the patient including screening of hyperlipidemia, renal function and hepatic function. If indicated by BPH, a PSA was ordered.  Medication reconciliation,  past medical history, social history, problem list and allergies were reviewed in detail with the patient  Goals were established with regard to weight loss, exercise, and  diet in compliance with medications  End of life planning was discussed.  He is due for his routine colonoscopy and has cologuard at home but has not sent in the test yet.   Review of Systems  Constitutional: Negative.   HENT: Negative.   Eyes: Negative.   Respiratory: Negative.   Cardiovascular: Negative.   Gastrointestinal: Negative.   Endocrine: Negative.   Genitourinary: Negative.   Musculoskeletal: Negative.   Skin: Negative.   Allergic/Immunologic: Negative.   Neurological: Negative.    Hematological: Negative.   Psychiatric/Behavioral: Negative.   All other systems reviewed and are negative.  Past Medical History:  Diagnosis Date  . Abnormal glucose   . Abnormal partial thromboplastin time (PTT)   . Arthritis    right shoulder pain   . Lupus anticoagulant positive    but negative for antiphosolipid antibody and anti B2 glycoprotein antibody  . Palpitations     Social History   Socioeconomic History  . Marital status: Married    Spouse name: Not on file  . Number of children: Not on file  . Years of education: Not on file  . Highest education level: Not on file  Occupational History  . Not on file  Tobacco Use  . Smoking status: Current Some Day Smoker    Packs/day: 0.25    Types: Cigarettes    Last attempt to quit: 05/24/2012    Years since quitting: 7.6  . Smokeless tobacco: Never Used  . Tobacco comment: trying to quit  Substance and Sexual Activity  . Alcohol use: Yes    Alcohol/week: 0.0 standard drinks    Comment: occ  . Drug use: No  . Sexual activity: Not on file  Other Topics Concern  . Not on file  Social History Narrative  . Not on file   Social Determinants of Health   Financial Resource Strain:   . Difficulty of Paying Living Expenses:   Food Insecurity:   . Worried About Charity fundraiser in the Last Year:   . [place] in the Last Year:  Transportation Needs:   . Freight forwarder (Medical):   Marland Kitchen Lack of Transportation (Non-Medical):   Physical Activity:   . Days of Exercise per Week:   . Minutes of Exercise per Session:   Stress:   . Feeling of Stress :   Social Connections:   . Frequency of Communication with Friends and Family:   . Frequency of Social Gatherings with Friends and Family:   . Attends Religious Services:   . Active Member of Clubs or Organizations:   . Attends Banker Meetings:   Marland Kitchen Marital Status:   Intimate Partner Violence:   . Fear of Current or Ex-Partner:   .  Emotionally Abused:   Marland Kitchen Physically Abused:   . Sexually Abused:     Past Surgical History:  Procedure Laterality Date  . left knee scope      Family History  Problem Relation Age of Onset  . Hypertension Father   . Lung cancer Father   . Hypertension Mother     Allergies  Allergen Reactions  . Zegerid [Omeprazole-Sodium Bicarbonate] Anaphylaxis    Rash, lip and hand swelling, itching , throat swelling    Current Outpatient Medications on File Prior to Visit  Medication Sig Dispense Refill  . aspirin 81 MG tablet Take 81 mg by mouth daily.    Marland Kitchen atorvastatin (LIPITOR) 20 MG tablet TAKE 1 TABLET BY MOUTH EVERY DAY 90 tablet 0  . metFORMIN (GLUCOPHAGE) 500 MG tablet TAKE 1/2 TABLET (250 MG TOTAL) BY MOUTH DAILY. 45 tablet 0  . sildenafil (REVATIO) 20 MG tablet Take 1-4 tabs as needed 20 tablet 2   No current facility-administered medications on file prior to visit.    There were no vitals taken for this visit.      Objective:   Physical Exam Vitals and nursing note reviewed.  Constitutional:      General: He is not in acute distress.    Appearance: Normal appearance. He is well-developed and normal weight. He is not diaphoretic.  HENT:     Head: Normocephalic and atraumatic.     Right Ear: Tympanic membrane, ear canal and external ear normal. There is no impacted cerumen.     Left Ear: Tympanic membrane, ear canal and external ear normal. There is no impacted cerumen.     Nose: Nose normal. No congestion or rhinorrhea.     Mouth/Throat:     Mouth: Mucous membranes are moist.     Pharynx: Oropharynx is clear. No oropharyngeal exudate or posterior oropharyngeal erythema.  Eyes:     General:        Right eye: No discharge.        Left eye: No discharge.     Extraocular Movements: Extraocular movements intact.     Conjunctiva/sclera: Conjunctivae normal.     Pupils: Pupils are equal, round, and reactive to light.  Neck:     Thyroid: No thyromegaly.     Vascular: No  carotid bruit.     Trachea: No tracheal deviation.  Cardiovascular:     Rate and Rhythm: Normal rate and regular rhythm.     Pulses: Normal pulses.     Heart sounds: Normal heart sounds. No murmur. No friction rub. No gallop.   Pulmonary:     Effort: Pulmonary effort is normal. No respiratory distress.     Breath sounds: Normal breath sounds. No stridor. No wheezing, rhonchi or rales.  Chest:     Chest wall: No tenderness.  Abdominal:  General: Bowel sounds are normal. There is no distension.     Palpations: Abdomen is soft. There is no mass.     Tenderness: There is no abdominal tenderness. There is no right CVA tenderness, left CVA tenderness, guarding or rebound.     Hernia: No hernia is present.  Musculoskeletal:        General: No swelling, tenderness, deformity or signs of injury. Normal range of motion.     Right lower leg: No edema.     Left lower leg: No edema.  Lymphadenopathy:     Cervical: No cervical adenopathy.  Skin:    General: Skin is warm and dry.     Capillary Refill: Capillary refill takes less than 2 seconds.     Coloration: Skin is not jaundiced or pale.     Findings: No bruising, erythema, lesion or rash.  Neurological:     General: No focal deficit present.     Mental Status: He is alert and oriented to person, place, and time.     Cranial Nerves: No cranial nerve deficit.     Sensory: No sensory deficit.     Motor: No weakness.     Coordination: Coordination normal.     Gait: Gait normal.     Deep Tendon Reflexes: Reflexes normal.  Psychiatric:        Mood and Affect: Mood normal.        Behavior: Behavior normal.        Thought Content: Thought content normal.        Judgment: Judgment normal.       Assessment & Plan:  1. Routine general medical examination at a health care facility - Needs to quit smoking  - Follow up in one year or sooner if needed - CBC with Differential/Platelet - Comprehensive metabolic panel - Hemoglobin A1c -  Lipid panel - TSH  2. Prostate cancer screening  - PSA  3. Glucose intolerance - Consider increase in metformin  - CBC with Differential/Platelet - Comprehensive metabolic panel - Hemoglobin A1c - Lipid panel - TSH  4. TOBACCO ABUSE - Needs to work on quitting smoking   5. Erectile dysfunction, unspecified erectile dysfunction type  - Testosterone Total,Free,Bio, Males - sildenafil (VIAGRA) 50 MG tablet; Take 1-2 tablets (50-100 mg total) by mouth daily as needed for erectile dysfunction.  Dispense: 30 tablet; Refill: 2   Shirline Frees, NP

## 2020-01-10 NOTE — Telephone Encounter (Signed)
Waiting on lab results

## 2020-01-13 LAB — TESTOSTERONE TOTAL,FREE,BIO, MALES
Albumin: 4.4 g/dL (ref 3.6–5.1)
Sex Hormone Binding: 26 nmol/L (ref 22–77)
Testosterone, Bioavailable: 127.3 ng/dL (ref >=110.0)
Testosterone, Free: 63.2 pg/mL (ref 46.0–224.0)
Testosterone: 397 ng/dL (ref 250–827)

## 2020-02-05 ENCOUNTER — Ambulatory Visit: Payer: Federal, State, Local not specified - PPO | Attending: Internal Medicine

## 2020-02-05 DIAGNOSIS — Z23 Encounter for immunization: Secondary | ICD-10-CM

## 2020-02-05 NOTE — Progress Notes (Signed)
   [id number]  Name:  Travis Rice.    MRN: [id number] DOB: September 06, 1961  02/05/2020  Travis Rice was observed post Covid-19 immunization for 15 minutes without incident. He was provided with Vaccine Information Sheet and instruction to access the V-Safe system.   Travis Rice was instructed to call 911 with any severe reactions post vaccine: Marland Kitchen Difficulty breathing  . Swelling of face and throat  . A fast heartbeat  . A bad rash all over body  . Dizziness and weakness   Immunizations Administered    Name Date Dose VIS Date Route   Moderna COVID-19 Vaccine 02/05/2020 10:16 AM 0.5 mL 09/24/2019 Intramuscular   Manufacturer: Moderna   Lot: [id number]   NDC: 16384-665-99

## 2020-02-06 ENCOUNTER — Other Ambulatory Visit: Payer: Self-pay

## 2020-02-06 ENCOUNTER — Encounter: Payer: Self-pay | Admitting: Adult Health

## 2020-02-06 ENCOUNTER — Telehealth (INDEPENDENT_AMBULATORY_CARE_PROVIDER_SITE_OTHER): Payer: Federal, State, Local not specified - PPO | Admitting: Adult Health

## 2020-02-06 ENCOUNTER — Encounter: Payer: Self-pay | Admitting: Family Medicine

## 2020-02-06 ENCOUNTER — Telehealth: Payer: Self-pay | Admitting: Adult Health

## 2020-02-06 VITALS — Temp 99.0°F | Wt 180.0 lb

## 2020-02-06 DIAGNOSIS — Z008 Encounter for other general examination: Secondary | ICD-10-CM | POA: Diagnosis not present

## 2020-02-06 DIAGNOSIS — N529 Male erectile dysfunction, unspecified: Secondary | ICD-10-CM

## 2020-02-06 MED ORDER — SILDENAFIL CITRATE 50 MG PO TABS: 50.0000 mg | ORAL_TABLET | Freq: Every day | ORAL | 2 refills | Status: DC | PRN

## 2020-02-06 NOTE — Progress Notes (Signed)
Virtual Visit via Telephone Note  I connected with Travis Rice. on 02/06/20 at  4:00 PM EDT by telephone and verified that I am speaking with the correct person using two identifiers.   I discussed the limitations, risks, security and privacy concerns of performing an evaluation and management service by telephone and the availability of in person appointments. I also discussed with the patient that there may be a patient responsible charge related to this service. The patient expressed understanding and agreed to proceed.  Location patient: home Location provider: work or home office Participants present for the call: patient, provider Patient did not have a visit in the prior 7 days to address this/these issue(s).   History of Present Illness: 59-year-old male who is being evaluated today for an acute issue of joint pain, low-grade fever, mild headache, and fatigue.  Symptoms started after his second Covid vaccination and he wants to make sure that these are all normal symptoms.  Also needs his Viagra sent to [facility] as his regular pharmacy was too expensive   Observations/Objective: Patient sounds cheerful and well on the phone. I do not appreciate any SOB. Speech and thought processing are grossly intact. Patient reported vitals:  Assessment and Plan: 1. Encounter for generalized patient complaints -He was advised that the symptoms he has been experiencing are very common immune response to the second Covid vaccination.  His symptoms should resolve within 2 days after receiving the second Covid vaccination.  He was advised to rest, can take Tylenol or Motrin for symptom relief, drink plenty of fluids. - Follow up PRN   2. Erectile dysfunction, unspecified erectile dysfunction type  - sildenafil (VIAGRA) 50 MG tablet; Take 1-2 tablets (50-100 mg total) by mouth daily as needed for erectile dysfunction.  Dispense: 30 tablet; Refill: 2   Follow Up Instructions:  I did not  refer this patient for an OV in the next 24 hours for this/these issue(s).  I discussed the assessment and treatment plan with the patient. The patient was provided an opportunity to ask questions and all were answered. The patient agreed with the plan and demonstrated an understanding of the instructions.   The patient was advised to call back or seek an in-person evaluation if the symptoms worsen or if the condition fails to improve as anticipated.  I provided 12 minutes of non-face-to-face time during this encounter.   Shirline Frees, NP

## 2020-02-06 NOTE — Telephone Encounter (Signed)
Pt call and want a call back about a note that was sent to him on mychart. He stated that is on shift limitation.

## 2020-02-07 ENCOUNTER — Encounter: Payer: Self-pay | Admitting: Adult Health

## 2020-02-07 ENCOUNTER — Encounter: Payer: Self-pay | Admitting: Family Medicine

## 2020-02-07 ENCOUNTER — Other Ambulatory Visit: Payer: Self-pay | Admitting: Adult Health

## 2020-02-07 DIAGNOSIS — N529 Male erectile dysfunction, unspecified: Secondary | ICD-10-CM

## 2020-02-07 NOTE — Telephone Encounter (Signed)
Sent to the pharmacy by e-scribe. 

## 2020-02-07 NOTE — Telephone Encounter (Signed)
Letter released to MyChart.  

## 2020-02-07 NOTE — Telephone Encounter (Signed)
Ok to send in 100 mg tabs

## 2020-03-17 ENCOUNTER — Other Ambulatory Visit: Payer: Self-pay | Admitting: Adult Health

## 2020-03-30 ENCOUNTER — Encounter: Payer: Self-pay | Admitting: Family Medicine

## 2020-03-30 ENCOUNTER — Ambulatory Visit (INDEPENDENT_AMBULATORY_CARE_PROVIDER_SITE_OTHER): Payer: Federal, State, Local not specified - PPO | Admitting: Family Medicine

## 2020-03-30 ENCOUNTER — Other Ambulatory Visit: Payer: Self-pay

## 2020-03-30 ENCOUNTER — Telehealth: Payer: Self-pay | Admitting: Adult Health

## 2020-03-30 VITALS — BP 132/84 | HR 88 | Temp 98.4°F | Wt 179.0 lb

## 2020-03-30 DIAGNOSIS — M25562 Pain in left knee: Secondary | ICD-10-CM

## 2020-03-30 DIAGNOSIS — G8929 Other chronic pain: Secondary | ICD-10-CM | POA: Diagnosis not present

## 2020-03-30 MED ORDER — IBUPROFEN 600 MG PO TABS: 600.0000 mg | ORAL_TABLET | Freq: Three times a day (TID) | ORAL | 0 refills | Status: AC | PRN

## 2020-03-30 NOTE — Progress Notes (Signed)
Subjective:    Patient ID: Travis Rice., male    DOB: 07/05/1961, 59 y.o.   MRN: [id number]  No chief complaint on file.   HPI Patient was seen today for ongoing concern followed by Sallee Provencal, NP.  Pt endorses chronic left knee pain recently worsened after hitting knee on a metal tray at work.  Knee initially swollen after injury.  Taking ibuprofen 600 mg daily.  Pt had arthroscopy on left knee 20+ years ago.  Recent MRI 12/17/2019 with tears in cartilage of left knee. Pt works at the [employer] as a Therapist, sports carrier.  States at times wears a knee brace which helps some.  Pt went to work today and is off tomorrow.  Tries to work through the pain but notes increasing difficulty.  Received cortisone injections in the past.  No recent follow-up with Ortho. Past Medical History:  Diagnosis Date  . Abnormal glucose   . Abnormal partial thromboplastin time (PTT)   . Arthritis    right shoulder pain   . Lupus anticoagulant positive    but negative for antiphosolipid antibody and anti B2 glycoprotein antibody  . Palpitations     Allergies  Allergen Reactions  . Zegerid [Omeprazole-Sodium Bicarbonate] Anaphylaxis    Rash, lip and hand swelling, itching , throat swelling    ROS General: Denies fever, chills, night sweats, changes in weight, changes in appetite HEENT: Denies headaches, ear pain, changes in vision, rhinorrhea, sore throat CV: Denies CP, palpitations, SOB, orthopnea Pulm: Denies SOB, cough, wheezing GI: Denies abdominal pain, nausea, vomiting, diarrhea, constipation GU: Denies dysuria, hematuria, frequency, vaginal discharge Msk: Denies muscle cramps  + left knee pain Neuro: Denies weakness, numbness, tingling Skin: Denies rashes, bruising Psych: Denies depression, anxiety, hallucinations      Objective:    Blood pressure 132/84, pulse 88, temperature 98.4 F (36.9 C), temperature source Temporal, weight 179 lb (81.2 kg), SpO2 97 %.   Gen. Pleasant,  well-nourished, in no distress, normal affect   HEENT: [place]/AT, face symmetric,no scleral icterus, PERRLA, EOMI, nares patent without drainage Lungs: no accessory muscle use Cardiovascular: RRR, no peripheral edema Musculoskeletal: No edema, erythema, or induration of left knee.  Mild TTP of left knee at patella.  No deformities, no cyanosis or clubbing, normal tone Neuro:  A&Ox3, CN II-XII intact, normal gait Skin:  Warm, no lesions/ rash   Wt Readings from Last 3 Encounters:  03/30/20 179 lb (81.2 kg)  02/06/20 180 lb (81.6 kg)  01/10/20 180 lb (81.6 kg)    Lab Results  Component Value Date   WBC 7.4 01/10/2020   HGB 14.4 01/10/2020   HCT 42.4 01/10/2020   PLT 371.0 01/10/2020   GLUCOSE 98 01/10/2020   CHOL 195 01/10/2020   TRIG 82.0 01/10/2020   HDL 93.60 01/10/2020   LDLDIRECT 144.4 01/30/2013   LDLCALC 85 01/10/2020   ALT 29 01/10/2020   AST 23 01/10/2020   NA 143 01/10/2020   K 4.6 01/10/2020   CL 109 01/10/2020   CREATININE 1.10 01/10/2020   BUN 17 01/10/2020   CO2 25 01/10/2020   TSH 0.52 01/10/2020   PSA 0.21 01/10/2020   INR 0.8 06/23/2008   HGBA1C 6.3 01/10/2020    Assessment/Plan:  Chronic pain of left knee - Plan: ibuprofen (ADVIL) 600 MG tablet -Worsened by acute injury -MRI left knee 12/17/2019 with 1.oblique tear of the posterior horn of medial meniscus extending to the inferior articular surface.  2. cartilage fissuring of the lateral patellar  facet -Discussed supportive care -For increasing pain consider follow-up with Ortho  F/u as needed with PCP  Abbe Amsterdam, MD

## 2020-03-30 NOTE — Patient Instructions (Addendum)
Chronic Knee Pain, Adult Chronic knee pain is pain in one or both knees that lasts longer than 3 months. Symptoms of chronic knee pain may include swelling, stiffness, and discomfort. Age-related wear and tear (osteoarthritis) of the knee joint is the most common cause of chronic knee pain. Other possible causes include:  A long-term immune-related disease that causes inflammation of the knee (rheumatoid arthritis). This usually affects both knees.  Inflammatory arthritis, such as gout or pseudogout.  An injury to the knee that causes arthritis.  An injury to the knee that damages the ligaments. Ligaments are strong tissues that connect bones to each other.  Runner's knee or pain behind the kneecap. Treatment for chronic knee pain depends on the cause. The main treatments for chronic knee pain are physical therapy and weight loss. This condition may also be treated with medicines, injections, a knee sleeve or brace, and by using crutches. Rest, ice, compression (pressure), and elevation (RICE) therapy may also be recommended. Follow these instructions at home: If you have a knee sleeve or brace:   Wear it as told by your health care provider. Remove it only as told by your health care provider.  Loosen it if your toes tingle, become numb, or turn cold and blue.  Keep it clean.  If the sleeve or brace is not waterproof: ? Do not let it get wet. ? Remove it if allowed by your health care provider, or cover it with a watertight covering when you take a bath or a shower. Managing pain, stiffness, and swelling      If directed, apply heat to the affected area as often as told by your health care provider. Use the heat source that your health care provider recommends, such as a moist heat pack or a heating pad. ? If you have a removable sleeve or brace, remove it as told by your health care provider. ? Place a towel between your skin and the heat source. ? Leave the heat on for 20-30  minutes. ? Remove the heat if your skin turns bright red. This is especially important if you are unable to feel pain, heat, or cold. You may have a greater risk of getting burned.  If directed, put ice on the affected area. ? If you have a removable sleeve or brace, remove it as told by your health care provider. ? Put ice in a plastic bag. ? Place a towel between your skin and the bag. ? Leave the ice on for 20 minutes, 2-3 times a day.  Move your toes often to reduce stiffness and swelling.  Raise (elevate) the injured area above the level of your heart while you are sitting or lying down. Activity  Avoid activities where both feet leave the ground at the same time (high-impact activities). Examples are running, jumping rope, and doing jumping jacks.  Return to your normal activities as told by your health care provider. Ask your health care provider what activities are safe for you.  Follow the exercise plan that your health care provider designed for you. Your health care provider may suggest that you: ? Avoid activities that make knee pain worse. This may require you to change your exercise routines, sport participation, or job duties. ? Wear shoes with cushioned soles. ? Avoid high-impact activities or sports that require running and sudden changes in direction. ? Do physical therapy as told by your health care provider. Physical therapy is planned to match your needs and abilities. It may include  exercises for strength, flexibility, stability, and endurance. ? Do exercises that increase balance and strength, such as tai chi and yoga.  Do not use the injured limb to support your body weight until your health care provider says that you can. Use crutches, a cane, or a walker, as told by your health care provider. General instructions  Take over-the-counter and prescription medicines only as told by your health care provider.  Lose weight if you are overweight. Losing even a little  weight can reduce knee pain. Ask your health care provider what your ideal weight is, and how to safely lose extra weight. A food expert (dietitian) may be able to help you plan your meals.  Do not use any products that contain nicotine or tobacco, such as cigarettes, e-cigarettes, and chewing tobacco. These can delay healing. If you need help quitting, ask your health care provider.  Keep all follow-up visits as told by your health care provider. This is important. Contact a health care provider if:  You have knee pain that is not getting better or gets worse.  You are unable to do your physical therapy exercises due to knee pain. Get help right away if:  Your knee swells and the swelling becomes worse.  You cannot move your knee.  You have severe knee pain. Summary  Knee pain that lasts more than 3 months is considered chronic knee pain.  The main treatments for chronic knee pain are physical therapy and weight loss. You may also need to take medicines, wear a knee sleeve or brace, use crutches, and apply ice or heat.  Losing even a little weight can reduce knee pain. Ask your health care provider what your ideal weight is, and how to safely lose extra weight. A food expert (dietitian) may be able to help you plan your meals.  Work with a physical therapist to make a safe exercise program, as told by your health care provider. This information is not intended to replace advice given to you by your health care provider. Make sure you discuss any questions you have with your health care provider. Document Revised: 12/20/2018 Document Reviewed: 12/20/2018 Elsevier Patient Education  [place].

## 2020-03-30 NOTE — Telephone Encounter (Signed)
FMLA paperwork to be filled out.  Placed in dr's folder.  Mail to patient at [address] [place], [place], [place] [address]  **Call patient when paperwork is complete to let patient know it is in the mail to him.

## 2020-03-31 NOTE — Telephone Encounter (Signed)
Form received and placed in Cory's red folder. 

## 2020-04-02 ENCOUNTER — Ambulatory Visit: Payer: Federal, State, Local not specified - PPO | Admitting: Adult Health

## 2020-04-02 DIAGNOSIS — Z0279 Encounter for issue of other medical certificate: Secondary | ICD-10-CM

## 2020-04-02 NOTE — Telephone Encounter (Signed)
Copy sent to scan and original mailed as directed.  Nothing further needed.

## 2020-04-21 ENCOUNTER — Encounter: Payer: Self-pay | Admitting: Adult Health

## 2020-04-21 ENCOUNTER — Other Ambulatory Visit: Payer: Self-pay

## 2020-04-21 ENCOUNTER — Ambulatory Visit: Payer: Federal, State, Local not specified - PPO | Admitting: Adult Health

## 2020-04-21 VITALS — BP 130/90 | Temp 97.3°F | Wt 180.0 lb

## 2020-04-21 DIAGNOSIS — G8929 Other chronic pain: Secondary | ICD-10-CM | POA: Diagnosis not present

## 2020-04-21 DIAGNOSIS — M25562 Pain in left knee: Secondary | ICD-10-CM | POA: Diagnosis not present

## 2020-04-21 NOTE — Progress Notes (Signed)
   Subjective:    Patient ID: Travis Frames., male    DOB: [birth year], 59 y.o.   MRN: [id number]  HPI 59 year old male who  has a past medical history of Abnormal glucose, Abnormal partial thromboplastin time (PTT), Arthritis, Lupus anticoagulant positive, and Palpitations.  He presents to the office today for a chronic issue of left knee pain. He was seen on 03/30/2020 by another provider after the pain became worse after hitting his knee on a metal try at work. The knee was initially swollen after injury. He was taking ibuprofen 600 mg daily.  Did have a MRI in February 2021 which showed tears in the cartilage of the left knee minimal arthritic changes.  Reports that he continues to have pretty significant pain in his left knee but taking Motrin 600 mg 2-3 times a day has helped. He has not been wearing his knee brace but plans to get a new one to start wearing. He is wondering if a steroid injection would be beneficial.   Review of Systems See HPI     Objective:   Physical Exam Vitals and nursing note reviewed.  Constitutional:      Appearance: Normal appearance.  Musculoskeletal:        General: Tenderness present. No swelling or deformity.     Left knee: Crepitus present. No swelling, ecchymosis or bony tenderness. Normal range of motion. Tenderness present over the patellar tendon.     Right lower leg: No edema.     Left lower leg: No edema.  Skin:    General: Skin is warm and dry.  Neurological:     General: No focal deficit present.     Mental Status: He is alert and oriented to person, place, and time.  Psychiatric:        Mood and Affect: Mood normal.        Behavior: Behavior normal.        Thought Content: Thought content normal.        Judgment: Judgment normal.       Assessment & Plan:  1. Chronic pain of left knee -Advised him that I did not believe a steroid injection would help much with his pain. Encouraged to continue Motrin as needed and use a knee  brace. Follow-up with his orthopedist for worsening left knee pain as he may need his knee scoped again.  Shirline Frees, NP

## 2020-05-25 ENCOUNTER — Ambulatory Visit: Payer: Federal, State, Local not specified - PPO | Admitting: Family Medicine

## 2020-06-15 ENCOUNTER — Other Ambulatory Visit: Payer: Self-pay | Admitting: Adult Health

## 2020-09-21 ENCOUNTER — Other Ambulatory Visit: Payer: Self-pay | Admitting: Adult Health

## 2020-09-21 DIAGNOSIS — R7309 Other abnormal glucose: Secondary | ICD-10-CM

## 2020-10-06 ENCOUNTER — Telehealth: Payer: Self-pay | Admitting: Adult Health

## 2020-10-06 NOTE — Telephone Encounter (Signed)
FMLA form to be filled out--placed in dr's folder.  Call [phone] upon completion.

## 2020-12-16 ENCOUNTER — Encounter: Payer: Self-pay | Admitting: Adult Health

## 2020-12-23 ENCOUNTER — Other Ambulatory Visit: Payer: Self-pay | Admitting: Adult Health

## 2020-12-24 ENCOUNTER — Ambulatory Visit: Payer: Federal, State, Local not specified - PPO | Admitting: Adult Health

## 2020-12-24 ENCOUNTER — Encounter: Payer: Self-pay | Admitting: Adult Health

## 2020-12-24 ENCOUNTER — Other Ambulatory Visit: Payer: Self-pay

## 2020-12-24 VITALS — BP 118/64 | HR 79 | Temp 98.3°F | Ht 68.0 in | Wt 182.0 lb

## 2020-12-24 DIAGNOSIS — G8929 Other chronic pain: Secondary | ICD-10-CM

## 2020-12-24 DIAGNOSIS — M25562 Pain in left knee: Secondary | ICD-10-CM

## 2020-12-24 NOTE — Progress Notes (Signed)
Subjective:    Patient ID: Travis Frames., male    DOB: 07-Jun-1961, 60 y.o.   MRN: [id number]  HPI 60 year old male who  has a past medical history of Abnormal glucose, Abnormal partial thromboplastin time (PTT), Arthritis, Lupus anticoagulant positive, and Palpitations.  He presents to the office today for multiple joint pain complaints.  Had chronic left knee pain for multiple years.  Was seen by Dr. Cleophas Dunker back in March 2021 at which time an MRI showed a tear of the medial meniscus at the root and posterior horn.  At this time he was not having much trouble or compromise of his activities.  Does have a history of arthroscopy about 20 years ago.  MRI also showed minimal degenerative changes.  Since he was comfortable it was recommended against arthroscopy at this time.   Today he reports that continues to have pain in his left knee, seems to be getting a little bit worse especially with change in positions and ambulation.  He is wondering if he can have a steroid injection today.  Denies popping, cracking, or locking   Review of Systems See HPI   Past Medical History:  Diagnosis Date  . Abnormal glucose   . Abnormal partial thromboplastin time (PTT)   . Arthritis    right shoulder pain   . Lupus anticoagulant positive    but negative for antiphosolipid antibody and anti B2 glycoprotein antibody  . Palpitations     Social History   Socioeconomic History  . Marital status: Married    Spouse name: Not on file  . Number of children: Not on file  . Years of education: Not on file  . Highest education level: Not on file  Occupational History  . Not on file  Tobacco Use  . Smoking status: Current Some Day Smoker    Packs/day: 0.25    Types: Cigarettes    Last attempt to quit: 05/24/2012    Years since quitting: 8.5  . Smokeless tobacco: Never Used  . Tobacco comment: trying to quit  Substance and Sexual Activity  . Alcohol use: Yes    Alcohol/week: 0.0 standard  drinks    Comment: occ  . Drug use: No  . Sexual activity: Not on file  Other Topics Concern  . Not on file  Social History Narrative  . Not on file   Social Determinants of Health   Financial Resource Strain: Not on file  Food Insecurity: Not on file  Transportation Needs: Not on file  Physical Activity: Not on file  Stress: Not on file  Social Connections: Not on file  Intimate Partner Violence: Not on file    Past Surgical History:  Procedure Laterality Date  . left knee scope      Family History  Problem Relation Age of Onset  . Hypertension Father   . Lung cancer Father   . Hypertension Mother     Allergies  Allergen Reactions  . Zegerid [Omeprazole-Sodium Bicarbonate] Anaphylaxis    Rash, lip and hand swelling, itching , throat swelling    Current Outpatient Medications on File Prior to Visit  Medication Sig Dispense Refill  . aspirin 81 MG tablet Take 81 mg by mouth daily.    Marland Kitchen atorvastatin (LIPITOR) 20 MG tablet Take 1 tablet (20 mg total) by mouth daily. Due for physical exam 90 tablet 1  . ibuprofen (ADVIL) 600 MG tablet Take 1 tablet (600 mg total) by mouth every 8 (eight) hours as  needed. 30 tablet 0  . metFORMIN (GLUCOPHAGE) 500 MG tablet TAKE 1/2 TABLET (250 MG TOTAL) BY MOUTH DAILY. 45 tablet 1  . sildenafil (VIAGRA) 100 MG tablet Take 0.5-1 tablets (50-100 mg total) by mouth daily as needed for erectile dysfunction. 30 tablet 1   No current facility-administered medications on file prior to visit.    BP 118/64 (BP Location: Left Arm, Patient Position: Sitting, Cuff Size: Normal)   Pulse 79   Temp 98.3 F (36.8 C) (Oral)   Ht 5\' 8"  (1.727 m)   Wt 182 lb (82.6 kg)   SpO2 97%   BMI 27.67 kg/m       Objective:   Physical Exam Vitals and nursing note reviewed.  Constitutional:      Appearance: Normal appearance.  Cardiovascular:     Rate and Rhythm: Normal rate and regular rhythm.     Pulses: Normal pulses.     Heart sounds: Normal heart  sounds.  Musculoskeletal:        General: No swelling. Normal range of motion.     Left knee: No swelling, effusion or crepitus. Normal range of motion. Tenderness present over the PCL. No medial joint line tenderness. Abnormal meniscus.     Instability Tests: Anterior drawer test positive. Posterior drawer test positive.  Skin:    General: Skin is warm and dry.  Neurological:     General: No focal deficit present.     Mental Status: He is alert and oriented to person, place, and time.  Psychiatric:        Mood and Affect: Mood normal.        Behavior: Behavior normal.        Thought Content: Thought content normal.        Judgment: Judgment normal.       Assessment & Plan:  1. Chronic pain of left knee -Advised against steroid injection today.  Would like him to follow-up with orthopedics for further evaluation  , NP

## 2021-01-26 ENCOUNTER — Encounter: Payer: Self-pay | Admitting: Adult Health

## 2021-02-09 ENCOUNTER — Encounter: Payer: Federal, State, Local not specified - PPO | Admitting: Adult Health

## 2021-03-24 ENCOUNTER — Other Ambulatory Visit: Payer: Self-pay | Admitting: Adult Health

## 2021-03-24 ENCOUNTER — Encounter: Payer: Federal, State, Local not specified - PPO | Admitting: Adult Health

## 2021-03-24 DIAGNOSIS — R7309 Other abnormal glucose: Secondary | ICD-10-CM

## 2021-08-26 ENCOUNTER — Other Ambulatory Visit: Payer: Self-pay

## 2021-08-26 ENCOUNTER — Encounter: Payer: Self-pay | Admitting: Adult Health

## 2021-08-26 ENCOUNTER — Ambulatory Visit (INDEPENDENT_AMBULATORY_CARE_PROVIDER_SITE_OTHER): Payer: Federal, State, Local not specified - PPO | Admitting: Adult Health

## 2021-08-26 VITALS — BP 138/80 | HR 97 | Temp 99.6°F | Ht 67.75 in | Wt 189.0 lb

## 2021-08-26 DIAGNOSIS — Z125 Encounter for screening for malignant neoplasm of prostate: Secondary | ICD-10-CM

## 2021-08-26 DIAGNOSIS — F17200 Nicotine dependence, unspecified, uncomplicated: Secondary | ICD-10-CM | POA: Diagnosis not present

## 2021-08-26 DIAGNOSIS — Z0000 Encounter for general adult medical examination without abnormal findings: Secondary | ICD-10-CM

## 2021-08-26 DIAGNOSIS — E7439 Other disorders of intestinal carbohydrate absorption: Secondary | ICD-10-CM

## 2021-08-26 DIAGNOSIS — Z1211 Encounter for screening for malignant neoplasm of colon: Secondary | ICD-10-CM

## 2021-08-26 DIAGNOSIS — I2510 Atherosclerotic heart disease of native coronary artery without angina pectoris: Secondary | ICD-10-CM | POA: Diagnosis not present

## 2021-08-26 DIAGNOSIS — Z23 Encounter for immunization: Secondary | ICD-10-CM

## 2021-08-26 LAB — CBC WITH DIFFERENTIAL/PLATELET
Basophils Absolute: 0 10*3/uL (ref 0.0–0.1)
Basophils Relative: 0.4 % (ref 0.0–3.0)
Eosinophils Absolute: 0.3 10*3/uL (ref 0.0–0.7)
Eosinophils Relative: 2.9 % (ref 0.0–5.0)
HCT: 44.6 % (ref 39.0–52.0)
Hemoglobin: 15 g/dL (ref 13.0–17.0)
Lymphocytes Relative: 27.4 % (ref 12.0–46.0)
Lymphs Abs: 2.5 10*3/uL (ref 0.7–4.0)
MCHC: 33.6 g/dL (ref 30.0–36.0)
MCV: 92.3 fl (ref 78.0–100.0)
Monocytes Absolute: 0.9 10*3/uL (ref 0.1–1.0)
Monocytes Relative: 10 % (ref 3.0–12.0)
Neutro Abs: 5.5 10*3/uL (ref 1.4–7.7)
Neutrophils Relative %: 59.3 % (ref 43.0–77.0)
Platelets: 380 10*3/uL (ref 150.0–400.0)
RBC: 4.83 Mil/uL (ref 4.22–5.81)
RDW: 14.4 % (ref 11.5–15.5)
WBC: 9.2 10*3/uL (ref 4.0–10.5)

## 2021-08-26 LAB — COMPREHENSIVE METABOLIC PANEL
ALT: 33 U/L (ref 0–53)
AST: 19 U/L (ref 0–37)
Albumin: 4.6 g/dL (ref 3.5–5.2)
Alkaline Phosphatase: 73 U/L (ref 39–117)
BUN: 18 mg/dL (ref 6–23)
CO2: 25 mEq/L (ref 19–32)
Calcium: 9.9 mg/dL (ref 8.4–10.5)
Chloride: 106 mEq/L (ref 96–112)
Creatinine, Ser: 1.28 mg/dL (ref 0.40–1.50)
GFR: 59.89 mL/min — ABNORMAL LOW (ref >60.00)
Glucose, Bld: 116 mg/dL — ABNORMAL HIGH (ref 70–99)
Potassium: 4.3 mEq/L (ref 3.5–5.1)
Sodium: 141 mEq/L (ref 135–145)
Total Bilirubin: 1 mg/dL (ref 0.2–1.2)
Total Protein: 7.3 g/dL (ref 6.0–8.3)

## 2021-08-26 LAB — LIPID PANEL
Cholesterol: 250 mg/dL — ABNORMAL HIGH (ref 0–200)
HDL: 87.2 mg/dL (ref >39.00)
NonHDL: 162.82
Total CHOL/HDL Ratio: 3
Triglycerides: 203 mg/dL — ABNORMAL HIGH (ref 0.0–149.0)
VLDL: 40.6 mg/dL — ABNORMAL HIGH (ref 0.0–40.0)

## 2021-08-26 LAB — PSA: PSA: 0.25 ng/mL (ref 0.10–4.00)

## 2021-08-26 LAB — TSH: TSH: 0.34 u[IU]/mL — ABNORMAL LOW (ref 0.35–5.50)

## 2021-08-26 LAB — HEMOGLOBIN A1C: Hgb A1c MFr Bld: 6.7 % — ABNORMAL HIGH (ref 4.6–6.5)

## 2021-08-26 LAB — LDL CHOLESTEROL, DIRECT: Direct LDL: 124 mg/dL

## 2021-08-26 NOTE — Patient Instructions (Signed)
It was great seeing you today   We will follow up with you regarding your blood work   Please start working out and eating healthy to help with weight loss  STOP SMOKING!   I will see you back in one year or sooner if needed

## 2021-08-26 NOTE — Progress Notes (Signed)
Subjective:    Patient ID: Travis Rice., male    DOB: [birth year], 60 y.o.   MRN: [id number]  HPI Patient presents for yearly preventative medicine examination. He is a pleasant 60 year old male who  has a past medical history of Abnormal glucose, Abnormal partial thromboplastin time (PTT), Arthritis, Lupus anticoagulant positive, and Palpitations.  He has retired from the postal service   Hyperlipidemia/Coronary Arteriosclerosis -takes Lipitor 20 mg daily.  He denies myalgia or fatigue Lab Results  Component Value Date   CHOL 195 01/10/2020   HDL 93.60 01/10/2020   LDLCALC 85 01/10/2020   LDLDIRECT 144.4 01/30/2013   TRIG 82.0 01/10/2020   CHOLHDL 2 01/10/2020   Glucose intolerance/prediabetes-prescribed metformin 250 mg daily. Denies side effects of medication Lab Results  Component Value Date   HGBA1C 6.3 01/10/2020   Erectile dysfunction-uses Viagra as needed  Tobacco use- continues to smoke about 3 cigarettes per day   All immunizations and health maintenance protocols were reviewed with the patient and needed orders were placed.  Appropriate screening laboratory values were ordered for the patient including screening of hyperlipidemia, renal function and hepatic function. If indicated by BPH, a PSA was ordered.  Medication reconciliation,  past medical history, social history, problem list and allergies were reviewed in detail with the patient  Goals were established with regard to weight loss, exercise, and  diet in compliance with medications. He is not exercising nor eating healthy.Plans to start going to the gym.   Wt Readings from Last 3 Encounters:  08/26/21 189 lb (85.7 kg)  12/24/20 182 lb (82.6 kg)  04/21/20 180 lb (81.6 kg)   He believes that he did cologuard last year but never got the results .  Review of Systems  Constitutional: Negative.   HENT: Negative.    Eyes: Negative.   Respiratory: Negative.    Cardiovascular: Negative.    Gastrointestinal: Negative.   Endocrine: Negative.   Genitourinary: Negative.   Musculoskeletal: Negative.   Skin: Negative.   Allergic/Immunologic: Negative.   Neurological: Negative.   Hematological: Negative.   Psychiatric/Behavioral: Negative.    All other systems reviewed and are negative.  Past Medical History:  Diagnosis Date   Abnormal glucose    Abnormal partial thromboplastin time (PTT)    Arthritis    right shoulder pain    Lupus anticoagulant positive    but negative for antiphosolipid antibody and anti B2 glycoprotein antibody   Palpitations     Social History   Socioeconomic History   Marital status: Married    Spouse name: Not on file   Number of children: Not on file   Years of education: Not on file   Highest education level: Not on file  Occupational History   Not on file  Tobacco Use   Smoking status: Some Days    Packs/day: 0.25    Types: Cigarettes    Last attempt to quit: 05/24/2012    Years since quitting: 9.2   Smokeless tobacco: Never   Tobacco comments:    trying to quit  Substance and Sexual Activity   Alcohol use: Yes    Alcohol/week: 0.0 standard drinks    Comment: occ   Drug use: No   Sexual activity: Not on file  Other Topics Concern   Not on file  Social History Narrative   Not on file   Social Determinants of Health   Financial Resource Strain: Not on file  Food Insecurity: Not on file  Transportation  Needs: Not on file  Physical Activity: Not on file  Stress: Not on file  Social Connections: Not on file  Intimate Partner Violence: Not on file    Past Surgical History:  Procedure Laterality Date   left knee scope      Family History  Problem Relation Age of Onset   Hypertension Father    Lung cancer Father    Hypertension Mother     Allergies  Allergen Reactions   Zegerid [Omeprazole-Sodium Bicarbonate] Anaphylaxis    Rash, lip and hand swelling, itching , throat swelling    Current Outpatient  Medications on File Prior to Visit  Medication Sig Dispense Refill   aspirin 81 MG tablet Take 81 mg by mouth daily.     atorvastatin (LIPITOR) 20 MG tablet Take 1 tablet (20 mg total) by mouth daily. Due for physical exam 90 tablet 1   ibuprofen (ADVIL) 600 MG tablet Take 1 tablet (600 mg total) by mouth every 8 (eight) hours as needed. 30 tablet 0   metFORMIN (GLUCOPHAGE) 500 MG tablet TAKE 1/2 TABLET (250 MG TOTAL) BY MOUTH DAILY. 45 tablet 1   sildenafil (VIAGRA) 100 MG tablet Take 0.5-1 tablets (50-100 mg total) by mouth daily as needed for erectile dysfunction. 30 tablet 1   No current facility-administered medications on file prior to visit.    There were no vitals taken for this visit.       Objective:   Physical Exam Vitals and nursing note reviewed.  Constitutional:      General: He is not in acute distress.    Appearance: Normal appearance. He is well-developed and normal weight.  HENT:     Head: Normocephalic and atraumatic.     Right Ear: Tympanic membrane, ear canal and external ear normal. There is no impacted cerumen.     Left Ear: Tympanic membrane, ear canal and external ear normal. There is no impacted cerumen.     Nose: Nose normal. No congestion or rhinorrhea.     Mouth/Throat:     Mouth: Mucous membranes are moist.     Pharynx: Oropharynx is clear. No oropharyngeal exudate or posterior oropharyngeal erythema.  Eyes:     General:        Right eye: No discharge.        Left eye: No discharge.     Extraocular Movements: Extraocular movements intact.     Conjunctiva/sclera: Conjunctivae normal.     Pupils: Pupils are equal, round, and reactive to light.  Neck:     Vascular: No carotid bruit.     Trachea: No tracheal deviation.  Cardiovascular:     Rate and Rhythm: Normal rate and regular rhythm.     Pulses: Normal pulses.     Heart sounds: Normal heart sounds. No murmur heard.   No friction rub. No gallop.  Pulmonary:     Effort: Pulmonary effort is  normal. No respiratory distress.     Breath sounds: Normal breath sounds. No stridor. No wheezing, rhonchi or rales.  Chest:     Chest wall: No tenderness.  Abdominal:     General: Bowel sounds are normal. There is no distension.     Palpations: Abdomen is soft. There is no mass.     Tenderness: There is no abdominal tenderness. There is no right CVA tenderness, left CVA tenderness, guarding or rebound.     Hernia: No hernia is present.  Musculoskeletal:        General: No swelling, tenderness, deformity or  signs of injury. Normal range of motion.     Right lower leg: No edema.     Left lower leg: No edema.  Lymphadenopathy:     Cervical: No cervical adenopathy.  Skin:    General: Skin is warm and dry.     Capillary Refill: Capillary refill takes less than 2 seconds.     Coloration: Skin is not jaundiced or pale.     Findings: No bruising, erythema, lesion or rash.  Neurological:     General: No focal deficit present.     Mental Status: He is alert and oriented to person, place, and time.     Cranial Nerves: No cranial nerve deficit.     Sensory: No sensory deficit.     Motor: No weakness.     Coordination: Coordination normal.     Gait: Gait normal.     Deep Tendon Reflexes: Reflexes normal.  Psychiatric:        Mood and Affect: Mood normal.        Behavior: Behavior normal.        Thought Content: Thought content normal.        Judgment: Judgment normal.      Assessment & Plan:  1. Routine general medical examination at a health care facility - Needs to start exercising and eating healthy - Follow up in one year or sooner if needed - CBC with Differential/Platelet; Future - Comprehensive metabolic panel; Future - Lipid panel; Future - TSH; Future - Hemoglobin A1c; Future  2. Prostate cancer screening  - PSA; Future  3. Glucose intolerance - Consider increasing metformin  - Needs to be less sedentary  - CBC with Differential/Platelet; Future - Comprehensive  metabolic panel; Future - Lipid panel; Future - TSH; Future - Hemoglobin A1c; Future  4. TOBACCO ABUSE - Encouraged to quit smoking   5. Atherosclerosis of native coronary artery of native heart without angina pectoris - Consider increase in statin  - CBC with Differential/Platelet; Future - Comprehensive metabolic panel; Future - Lipid panel; Future - TSH; Future - Hemoglobin A1c; Future  6. Colon cancer screening - Checked cologuard system and no record of him. Will reorder.  - Cologuard   7. Need for pneumococcal vaccination - Pneumococcal conjugate vaccine 20-valent (Prevnar 20)  Shirline Frees, NP

## 2021-08-27 ENCOUNTER — Telehealth: Payer: Self-pay | Admitting: Adult Health

## 2021-08-27 DIAGNOSIS — R7309 Other abnormal glucose: Secondary | ICD-10-CM

## 2021-08-27 MED ORDER — ATORVASTATIN CALCIUM 40 MG PO TABS: 40.0000 mg | ORAL_TABLET | Freq: Every day | ORAL | 3 refills | Status: DC

## 2021-08-27 MED ORDER — METFORMIN HCL 500 MG PO TABS: 500.0000 mg | ORAL_TABLET | Freq: Two times a day (BID) | ORAL | 0 refills | Status: DC

## 2021-08-27 NOTE — Telephone Encounter (Signed)
Spoke to patient and informed him of his labs   His A1c is now 6.7 - will increase his Metformin from 250 mg daily to 500 mg BID   Cholesterol panel also elevated will increase Lipitor from 20 mg to 40 mg   Advised follow up in three months and needs to really start working on lifestyle modifications

## 2021-09-20 ENCOUNTER — Telehealth: Payer: Self-pay

## 2021-09-20 NOTE — Telephone Encounter (Signed)
Patient called asking for lab orders to be placed for patient's 3 month follow up lab appt 2/9

## 2021-09-29 NOTE — Telephone Encounter (Signed)
Pt lab appt. Has been canceled. Pt set up with [facility] for 3 month f/u.

## 2021-11-25 ENCOUNTER — Ambulatory Visit: Payer: Federal, State, Local not specified - PPO | Admitting: Adult Health

## 2021-12-02 ENCOUNTER — Other Ambulatory Visit: Payer: Federal, State, Local not specified - PPO

## 2022-11-02 ENCOUNTER — Encounter (HOSPITAL_COMMUNITY): Payer: Self-pay

## 2022-11-02 ENCOUNTER — Ambulatory Visit (HOSPITAL_COMMUNITY): Payer: Self-pay

## 2022-11-02 ENCOUNTER — Ambulatory Visit (HOSPITAL_COMMUNITY)
Admission: RE | Admit: 2022-11-02 | Discharge: 2022-11-02 | Disposition: A | Discharge status: Home / Self Care | Payer: Federal, State, Local not specified - PPO | Source: Ambulatory Visit | Attending: Emergency Medicine | Admitting: Emergency Medicine

## 2022-11-02 VITALS — BP 163/104 | HR 97 | Temp 99.0°F | Resp 17

## 2022-11-02 DIAGNOSIS — L02512 Cutaneous abscess of left hand: Secondary | ICD-10-CM | POA: Diagnosis not present

## 2022-11-02 DIAGNOSIS — L03012 Cellulitis of left finger: Secondary | ICD-10-CM

## 2022-11-02 MED ORDER — DOXYCYCLINE HYCLATE 100 MG PO CAPS: 100.0000 mg | ORAL_CAPSULE | Freq: Two times a day (BID) | ORAL | 0 refills | Status: DC

## 2022-11-02 NOTE — ED Provider Notes (Addendum)
[facility]    CSN: [id number] Arrival date & time: 11/02/22  1247      History   Chief Complaint Chief Complaint  Patient presents with   appt 1    HPI Travis Rice. is a 62 y.o. male.   Patient presents for evaluation of pain, swelling and erythema to the left thumb beginning 7 days ago.  Noticed pus underneath the skin for the last 3 to 4 days.  Has attempted use of Advil which has been helpful in managing pain.  Has full range of motion of finger.  Denies injury or trauma. .   Past Medical History:  Diagnosis Date   Abnormal glucose    Abnormal partial thromboplastin time (PTT)    Arthritis    right shoulder pain    Lupus anticoagulant positive    but negative for antiphosolipid antibody and anti B2 glycoprotein antibody   Palpitations     Patient Active Problem List   Diagnosis Date Noted   Right shoulder pain 07/21/2015   Pain in left knee 01/11/2013   Preventative health care 12/07/2011   HYPERGLYCEMIA 06/18/2009   PRIMARY HYPERCOAGULABLE STATE 07/09/2008   TOBACCO ABUSE 07/09/2008   Coronary atherosclerosis 07/09/2008    Past Surgical History:  Procedure Laterality Date   left knee scope         Home Medications    Prior to Admission medications   Medication Sig Start Date End Date Taking? Authorizing Provider  atorvastatin (LIPITOR) 40 MG tablet Take 1 tablet (40 mg total) by mouth daily. Due for physical exam 08/27/21 11/25/21  Dorothyann Peng, NP  ibuprofen (ADVIL) 600 MG tablet Take 1 tablet (600 mg total) by mouth every 8 (eight) hours as needed. 03/30/20   Billie Ruddy, MD  metFORMIN (GLUCOPHAGE) 500 MG tablet Take 1 tablet (500 mg total) by mouth 2 (two) times daily with a meal. 08/27/21 11/25/21  Nafziger, Tommi Rumps, NP  sildenafil (VIAGRA) 100 MG tablet Take 0.5-1 tablets (50-100 mg total) by mouth daily as needed for erectile dysfunction. 02/07/20   Nafziger, Tommi Rumps, NP    Family History Family History  Problem Relation Age of Onset    Hypertension Father    Lung cancer Father    Hypertension Mother     Social History Social History   Tobacco Use   Smoking status: Some Days    Packs/day: 0.25    Types: Cigarettes    Last attempt to quit: 05/24/2012    Years since quitting: 10.4   Smokeless tobacco: Never   Tobacco comments:    trying to quit  Substance Use Topics   Alcohol use: Yes    Alcohol/week: 0.0 standard drinks of alcohol    Comment: occ   Drug use: No     Allergies   Zegerid [omeprazole-sodium bicarbonate]   Review of Systems Review of Systems   Physical Exam Triage Vital Signs ED Triage Vitals  Enc Vitals Group     BP 11/02/22 1309 (!) 163/104     Pulse Rate 11/02/22 1309 97     Resp 11/02/22 1309 17     Temp 11/02/22 1309 99 F (37.2 C)     Temp Source 11/02/22 1309 Oral     SpO2 11/02/22 1309 99 %     Weight --      Height --      Head Circumference --      Peak Flow --      Pain Score 11/02/22 1307 8  Pain Loc --      Pain Edu? --      Excl. in GC? --    No data found.  Updated Vital Signs BP (!) 163/104 (BP Location: Right Arm)   Pulse 97   Temp 99 F (37.2 C) (Oral)   Resp 17   SpO2 99%   Visual Acuity Right Eye Distance:   Left Eye Distance:   Bilateral Distance:    Right Eye Near:   Left Eye Near:    Bilateral Near:     Physical Exam Constitutional:      Appearance: Normal appearance.  Eyes:     Extraocular Movements: Extraocular movements intact.  Skin:    Comments: Tenderness, mild to moderate swelling, erythema and pus underneath the skin present to the medial aspect of the left thumb nailbed, sensation intact, capillary refills intact, full range of motion of finger, 2+ radial pulse  Neurological:     Mental Status: He is alert and oriented to person, place, and time. Mental status is at baseline.      UC Treatments / Results  Labs (all labs ordered are listed, but only abnormal results are displayed) Labs Reviewed - No data to  display  EKG   Radiology No results found.  Procedures Incision and Drainage  Date/Time: 11/08/2022 8:47 AM  Performed by: Valinda Hoar, NP Authorized by: Valinda Hoar, NP   Consent:    Consent obtained:  Verbal   Consent given by:  Patient   Risks, benefits, and alternatives were discussed: yes     Risks discussed:  Bleeding and incomplete drainage   Alternatives discussed:  No treatment Universal protocol:    Procedure explained and questions answered to patient or proxy's satisfaction: yes     Patient identity confirmed:  Verbally with patient Location:    Type:  Abscess   Size:  0.5 Pre-procedure details:    Skin preparation:  Chlorhexidine Anesthesia:    Anesthesia method:  Topical application   Topical anesthesia: lidocaine mist. Procedure type:    Complexity:  Simple Procedure details:    Ultrasound guidance: no     Needle aspiration: no     Incision types:  Stab incision   Drainage:  Bloody and purulent   Drainage amount:  Moderate   Packing materials:  None Post-procedure details:    Procedure completion:  Tolerated  (including critical care time)  Medications Ordered in UC Medications - No data to display  Initial Impression / Assessment and Plan / UC Course  I have reviewed the triage vital signs and the nursing notes.  Pertinent labs & imaging results that were available during my care of the patient were reviewed by me and considered in my medical decision making (see chart for details).  Paronychia of left thumb, abscess of left thumb  Puncture and aspirate completed in office, site numbed with lidocaine mist, 18-gauge needle used for puncture, nonadherent dressing.  Able to expel puslike drainage, prescribe doxycycline for outpatient use and recommended over-the-counter analgesics for management of pain, advised use of warm compresses or soaks to help soften tissue and facilitate drainage, given strict precautions for worsening or  persisting symptoms to follow-up for reevaluation Final Clinical Impressions(s) / UC Diagnoses   Final diagnoses:  None   Discharge Instructions   None    ED Prescriptions   None    PDMP not reviewed this encounter.   Valinda Hoar, NP 11/02/22 1338    Valinda Hoar, NP 11/08/22  Nixon, [place], NP 11/08/22 1934

## 2022-11-02 NOTE — Discharge Instructions (Signed)
Today you are being treated for an infection of your fingernail, site has been drained here in office which will help with pain as it will release the pressure underneath the skin  Take doxycycline every morning and every evening for 7 days  Hold warm-hot compresses to affected area at least 4 times a day, this helps to facilitate draining, the more the better  Please return for evaluation for increased swelling, increased tenderness or pain, non healing site, non draining site, you begin to have fever or chills

## 2022-11-02 NOTE — ED Triage Notes (Signed)
Pt c/o swelling around left thumb finger nail. Taking advil for pain.

## 2023-08-23 ENCOUNTER — Ambulatory Visit: Payer: Federal, State, Local not specified - PPO | Admitting: Adult Health

## 2023-08-24 ENCOUNTER — Encounter: Payer: Self-pay | Admitting: Adult Health

## 2023-08-24 ENCOUNTER — Ambulatory Visit: Payer: Federal, State, Local not specified - PPO

## 2023-08-24 ENCOUNTER — Ambulatory Visit (INDEPENDENT_AMBULATORY_CARE_PROVIDER_SITE_OTHER): Payer: Federal, State, Local not specified - PPO | Admitting: Adult Health

## 2023-08-24 VITALS — BP 160/80 | HR 97 | Temp 98.8°F | Ht 67.25 in | Wt 186.0 lb

## 2023-08-24 DIAGNOSIS — Z125 Encounter for screening for malignant neoplasm of prostate: Secondary | ICD-10-CM | POA: Diagnosis not present

## 2023-08-24 DIAGNOSIS — M5412 Radiculopathy, cervical region: Secondary | ICD-10-CM | POA: Diagnosis not present

## 2023-08-24 DIAGNOSIS — M47812 Spondylosis without myelopathy or radiculopathy, cervical region: Secondary | ICD-10-CM | POA: Diagnosis not present

## 2023-08-24 DIAGNOSIS — E119 Type 2 diabetes mellitus without complications: Secondary | ICD-10-CM

## 2023-08-24 DIAGNOSIS — I10 Essential (primary) hypertension: Secondary | ICD-10-CM | POA: Diagnosis not present

## 2023-08-24 DIAGNOSIS — Z7984 Long term (current) use of oral hypoglycemic drugs: Secondary | ICD-10-CM

## 2023-08-24 DIAGNOSIS — F17200 Nicotine dependence, unspecified, uncomplicated: Secondary | ICD-10-CM

## 2023-08-24 DIAGNOSIS — Z0000 Encounter for general adult medical examination without abnormal findings: Secondary | ICD-10-CM

## 2023-08-24 DIAGNOSIS — R200 Anesthesia of skin: Secondary | ICD-10-CM | POA: Diagnosis not present

## 2023-08-24 DIAGNOSIS — N529 Male erectile dysfunction, unspecified: Secondary | ICD-10-CM

## 2023-08-24 DIAGNOSIS — Z1211 Encounter for screening for malignant neoplasm of colon: Secondary | ICD-10-CM

## 2023-08-24 LAB — COMPREHENSIVE METABOLIC PANEL
ALT: 22 U/L (ref 0–53)
AST: 15 U/L (ref 0–37)
Albumin: 4.6 g/dL (ref 3.5–5.2)
Alkaline Phosphatase: 82 U/L (ref 39–117)
BUN: 17 mg/dL (ref 6–23)
CO2: 24 meq/L (ref 19–32)
Calcium: 10.1 mg/dL (ref 8.4–10.5)
Chloride: 107 meq/L (ref 96–112)
Creatinine, Ser: 1.26 mg/dL (ref 0.40–1.50)
GFR: 60.19 mL/min (ref >60.00)
Glucose, Bld: 127 mg/dL — ABNORMAL HIGH (ref 70–99)
Potassium: 4.3 meq/L (ref 3.5–5.1)
Sodium: 142 meq/L (ref 135–145)
Total Bilirubin: 0.9 mg/dL (ref 0.2–1.2)
Total Protein: 7.6 g/dL (ref 6.0–8.3)

## 2023-08-24 LAB — LIPID PANEL
Cholesterol: 331 mg/dL — ABNORMAL HIGH (ref 0–200)
HDL: 92.4 mg/dL (ref >39.00)
LDL Cholesterol: 178 mg/dL — ABNORMAL HIGH (ref 0–99)
NonHDL: 238.91
Total CHOL/HDL Ratio: 4
Triglycerides: 304 mg/dL — ABNORMAL HIGH (ref 0.0–149.0)
VLDL: 60.8 mg/dL — ABNORMAL HIGH (ref 0.0–40.0)

## 2023-08-24 LAB — CBC
HCT: 45.9 % (ref 39.0–52.0)
Hemoglobin: 15.1 g/dL (ref 13.0–17.0)
MCHC: 33 g/dL (ref 30.0–36.0)
MCV: 93.9 fl (ref 78.0–100.0)
Platelets: 403 K/uL — ABNORMAL HIGH (ref 150.0–400.0)
RBC: 4.88 Mil/uL (ref 4.22–5.81)
RDW: 14.1 % (ref 11.5–15.5)
WBC: 9.5 K/uL (ref 4.0–10.5)

## 2023-08-24 LAB — HEMOGLOBIN A1C: Hgb A1c MFr Bld: 7 % — ABNORMAL HIGH (ref 4.6–6.5)

## 2023-08-24 LAB — TSH: TSH: 0.7 u[IU]/mL (ref 0.35–5.50)

## 2023-08-24 LAB — MICROALBUMIN / CREATININE URINE RATIO
Creatinine,U: 184.8 mg/dL
Microalb Creat Ratio: 73 mg/g — ABNORMAL HIGH (ref 0.0–30.0)
Microalb, Ur: 134.8 mg/dL — ABNORMAL HIGH (ref 0.0–1.9)

## 2023-08-24 LAB — PSA: PSA: 0.46 ng/mL (ref 0.10–4.00)

## 2023-08-24 MED ORDER — AMLODIPINE BESYLATE 5 MG PO TABS
5.0000 mg | ORAL_TABLET | Freq: Every day | ORAL | 0 refills | Status: DC
Start: 2023-08-24 — End: 2023-12-20

## 2023-08-24 NOTE — Patient Instructions (Signed)
It was great seeing you today   We will follow up with you regarding your lab work   Please let me know if you need anything   I am going to start you on a blood pressure called Norvasc. Take this daily. Monitor your blood pressure at home.

## 2023-08-24 NOTE — Progress Notes (Signed)
Subjective:    Patient ID: Travis Rice., male    DOB: 04/16/1961, 62 y.o.   MRN: [id number]  HPI  Patient presents for yearly preventative medicine examination. He is a 62 year old male who  has a past medical history of Abnormal glucose, Abnormal partial thromboplastin time (PTT), Arthritis, Lupus anticoagulant positive, and Palpitations.  Hyperlipidemia/Coronary Arteriosclerosis -takes Lipitor 20 mg daily.  He denies myalgia or fatigue Lab Results  Component Value Date   CHOL 250 (H) 08/26/2021   HDL 87.20 08/26/2021   LDLCALC 85 01/10/2020   LDLDIRECT 124.0 08/26/2021   TRIG 203.0 (H) 08/26/2021   CHOLHDL 3 08/26/2021   DM Type 2- Diagnosed in 08/2021 prescribed metformin 500 mg twice daily. He has not had this medication since early 2023.  Lab Results  Component Value Date   HGBA1C 6.7 (H) 08/26/2021   HGBA1C 6.3 01/10/2020   HGBA1C 6.2 06/14/2019    Erectile dysfunction-uses Viagra as needed  Tobacco use- continues to smoke about 2-3 cigarettes per day   HTN - not currently on medication. He does not check his BP at home.   BP Readings from Last 3 Encounters:  08/24/23 (!) 160/80  11/02/22 (!) 163/104  08/26/21 138/80   Numbness and tingling in left hand -he reports that over the last 6 months he has been experiencing numbness/tingling sensation at the tips of his fingers.  This does not interfere with grip strength.  Numbness and tingling can radiate up into the forearm.  Symptoms are not severe and do not interfere with activities of daily living.  He has not had any chest pain or shortness of breath.   All immunizations and health maintenance protocols were reviewed with the patient and needed orders were placed.  Appropriate screening laboratory values were ordered for the patient including screening of hyperlipidemia, renal function and hepatic function. If indicated by BPH, a PSA was ordered.  Medication reconciliation,  past medical history, social  history, problem list and allergies were reviewed in detail with the patient  Goals were established with regard to weight loss, exercise, and  diet in compliance with medications. His diet has been pretty good, he tries to eat healthy. He has not been exercising much. Wt Readings from Last 3 Encounters:  08/24/23 186 lb (84.4 kg)  08/26/21 189 lb (85.7 kg)  12/24/20 182 lb (82.6 kg)   He is overdue for colon cancer screening   Review of Systems  Constitutional: Negative.   HENT: Negative.    Eyes: Negative.   Respiratory: Negative.    Cardiovascular: Negative.   Gastrointestinal: Negative.   Endocrine: Negative.   Genitourinary: Negative.   Musculoskeletal: Negative.   Skin: Negative.   Allergic/Immunologic: Negative.   Neurological:  Positive for numbness.  Hematological: Negative.   Psychiatric/Behavioral: Negative.    All other systems reviewed and are negative.  Past Medical History:  Diagnosis Date   Abnormal glucose    Abnormal partial thromboplastin time (PTT)    Arthritis    right shoulder pain    Lupus anticoagulant positive    but negative for antiphosolipid antibody and anti B2 glycoprotein antibody   Palpitations     Social History   Socioeconomic History   Marital status: Married    Spouse name: Not on file   Number of children: Not on file   Years of education: Not on file   Highest education level: Not on file  Occupational History   Not on file  Tobacco  Use   Smoking status: Every Day    Current packs/day: 0.50    Average packs/day: 0.5 packs/day for 13.8 years (6.9 ttl pk-yrs)    Types: Cigarettes    Start date: 2011   Smokeless tobacco: Never   Tobacco comments:    trying to quit  Substance and Sexual Activity   Alcohol use: Yes    Alcohol/week: 0.0 standard drinks of alcohol    Comment: occ   Drug use: No   Sexual activity: Not on file  Other Topics Concern   Not on file  Social History Narrative   Not on file   Social  Determinants of Health   Financial Resource Strain: Not on file  Food Insecurity: Not on file  Transportation Needs: Not on file  Physical Activity: Not on file  Stress: Not on file  Social Connections: Not on file  Intimate Partner Violence: Not on file    Past Surgical History:  Procedure Laterality Date   left knee scope      Family History  Problem Relation Age of Onset   Hypertension Father    Lung cancer Father    Hypertension Mother     Allergies  Allergen Reactions   Zegerid [Omeprazole-Sodium Bicarbonate] Anaphylaxis    Rash, lip and hand swelling, itching , throat swelling    Current Outpatient Medications on File Prior to Visit  Medication Sig Dispense Refill   atorvastatin (LIPITOR) 40 MG tablet Take 1 tablet (40 mg total) by mouth daily. Due for physical exam 90 tablet 3   ibuprofen (ADVIL) 600 MG tablet Take 1 tablet (600 mg total) by mouth every 8 (eight) hours as needed. 30 tablet 0   metFORMIN (GLUCOPHAGE) 500 MG tablet Take 1 tablet (500 mg total) by mouth 2 (two) times daily with a meal. 180 tablet 0   sildenafil (VIAGRA) 100 MG tablet Take 0.5-1 tablets (50-100 mg total) by mouth daily as needed for erectile dysfunction. 30 tablet 1   No current facility-administered medications on file prior to visit.    BP (!) 160/80   Pulse 97   Temp 98.8 F (37.1 C) (Oral)   Ht 5' 7.25" (1.708 m)   Wt 186 lb (84.4 kg)   SpO2 99%   BMI 28.92 kg/m       Objective:   Physical Exam Vitals and nursing note reviewed.  Constitutional:      General: He is not in acute distress.    Appearance: Normal appearance. He is overweight. He is not ill-appearing.  HENT:     Head: Normocephalic and atraumatic.     Right Ear: Tympanic membrane, ear canal and external ear normal. There is no impacted cerumen.     Left Ear: Tympanic membrane, ear canal and external ear normal. There is no impacted cerumen.     Nose: Nose normal. No congestion or rhinorrhea.      Mouth/Throat:     Mouth: Mucous membranes are moist.     Pharynx: Oropharynx is clear.  Eyes:     Extraocular Movements: Extraocular movements intact.     Conjunctiva/sclera: Conjunctivae normal.     Pupils: Pupils are equal, round, and reactive to light.  Neck:     Vascular: No carotid bruit.  Cardiovascular:     Rate and Rhythm: Normal rate and regular rhythm.     Pulses: Normal pulses.     Heart sounds: No murmur heard.    No friction rub. No gallop.  Pulmonary:  Effort: Pulmonary effort is normal.     Breath sounds: Normal breath sounds.  Abdominal:     General: Abdomen is flat. Bowel sounds are normal. There is no distension.     Palpations: Abdomen is soft. There is no mass.     Tenderness: There is no abdominal tenderness. There is no guarding or rebound.     Hernia: No hernia is present.  Musculoskeletal:        General: Normal range of motion.     Cervical back: Normal range of motion and neck supple.  Lymphadenopathy:     Cervical: No cervical adenopathy.  Skin:    General: Skin is warm and dry.     Capillary Refill: Capillary refill takes less than 2 seconds.  Neurological:     General: No focal deficit present.     Mental Status: He is alert and oriented to person, place, and time.     Cranial Nerves: Cranial nerves 2-12 are intact.     Sensory: Sensation is intact.     Motor: Motor function is intact. No weakness or tremor.  Psychiatric:        Mood and Affect: Mood normal.        Behavior: Behavior normal.        Thought Content: Thought content normal.        Judgment: Judgment normal.       Assessment & Plan:  1. Routine general medical examination at a health care facility Today patient counseled on age appropriate routine health concerns for screening and prevention, each reviewed and up to date or declined. Immunizations reviewed and up to date or declined. Labs ordered and reviewed. Risk factors for depression reviewed and negative. Hearing  function and visual acuity are intact. ADLs screened and addressed as needed. Functional ability and level of safety reviewed and appropriate. Education, counseling and referrals performed based on assessed risks today. Patient provided with a copy of personalized plan for preventive services. - Eat healthy and exercise on a routine basis - Stop smoking  - He plans to come back for vaccinations  - Follow up in one year for CPE   2. Diabetes mellitus treated with oral medication (HCC) - Likely place back on Metformin  - Follow up in 3 months  - Lipid panel; Future - TSH; Future - CBC; Future - Comprehensive metabolic panel; Future - Hemoglobin A1c; Future - Microalbumin/Creatinine Ratio, Urine; Future  3. Erectile dysfunction, unspecified erectile dysfunction type - continue with Viagra PRN  - Lipid panel; Future - TSH; Future - CBC; Future - Comprehensive metabolic panel; Future - Hemoglobin A1c; Future - Microalbumin/Creatinine Ratio, Urine; Future  4. TOBACCO ABUSE - needs to quit completely   5. Prostate cancer screening  - PSA; Future  6. Screening for colon cancer  - Cologuard  7. Primary hypertension -New diagnosis.  Will start on Norvasc 5 mg daily.  We discussed the effects of the medication.  Will have him monitor his blood pressure at home.  We are going to bring him back in 3 months for diabetes and hypertension check. - Lipid panel; Future - TSH; Future - CBC; Future - Comprehensive metabolic panel; Future - amLODipine (NORVASC) 5 MG tablet; Take 1 tablet (5 mg total) by mouth daily.  Dispense: 90 tablet; Refill: 0  8. Cervical radiculopathy - Will do xray today and consider MRI in the future  - DG Cervical Spine Complete; Future

## 2023-08-25 ENCOUNTER — Other Ambulatory Visit: Payer: Self-pay | Admitting: Adult Health

## 2023-08-25 DIAGNOSIS — R7309 Other abnormal glucose: Secondary | ICD-10-CM

## 2023-08-25 MED ORDER — ATORVASTATIN CALCIUM 40 MG PO TABS: 40.0000 mg | ORAL_TABLET | Freq: Every day | ORAL | 3 refills | Status: DC

## 2023-08-25 MED ORDER — METFORMIN HCL 500 MG PO TABS: 500.0000 mg | ORAL_TABLET | Freq: Two times a day (BID) | ORAL | 0 refills | Status: DC

## 2023-09-20 ENCOUNTER — Other Ambulatory Visit: Payer: Self-pay | Admitting: Adult Health

## 2023-09-20 DIAGNOSIS — M5412 Radiculopathy, cervical region: Secondary | ICD-10-CM

## 2023-10-13 ENCOUNTER — Other Ambulatory Visit: Payer: Federal, State, Local not specified - PPO

## 2023-11-24 ENCOUNTER — Ambulatory Visit: Payer: Federal, State, Local not specified - PPO | Admitting: Adult Health

## 2023-12-20 ENCOUNTER — Other Ambulatory Visit: Payer: Self-pay | Admitting: Adult Health

## 2023-12-20 DIAGNOSIS — I10 Essential (primary) hypertension: Secondary | ICD-10-CM

## 2023-12-20 DIAGNOSIS — R7309 Other abnormal glucose: Secondary | ICD-10-CM

## 2023-12-26 ENCOUNTER — Other Ambulatory Visit: Payer: Self-pay | Admitting: Adult Health

## 2023-12-26 DIAGNOSIS — I10 Essential (primary) hypertension: Secondary | ICD-10-CM

## 2023-12-26 DIAGNOSIS — R7309 Other abnormal glucose: Secondary | ICD-10-CM

## 2024-04-01 ENCOUNTER — Other Ambulatory Visit: Payer: Self-pay | Admitting: Adult Health

## 2024-04-01 DIAGNOSIS — R7309 Other abnormal glucose: Secondary | ICD-10-CM

## 2024-04-01 DIAGNOSIS — I10 Essential (primary) hypertension: Secondary | ICD-10-CM

## 2024-04-03 NOTE — Telephone Encounter (Signed)
 Pt needs a physical and dm f/u//krj. Last appts were canceled.

## 2024-07-06 ENCOUNTER — Other Ambulatory Visit: Payer: Self-pay | Admitting: Adult Health

## 2024-08-28 ENCOUNTER — Ambulatory Visit (INDEPENDENT_AMBULATORY_CARE_PROVIDER_SITE_OTHER): Payer: Self-pay | Admitting: Adult Health

## 2024-08-28 ENCOUNTER — Encounter: Payer: Self-pay | Admitting: Adult Health

## 2024-08-28 VITALS — BP 130/70 | HR 96 | Temp 98.1°F | Ht 66.25 in | Wt 188.0 lb

## 2024-08-28 DIAGNOSIS — I2510 Atherosclerotic heart disease of native coronary artery without angina pectoris: Secondary | ICD-10-CM

## 2024-08-28 DIAGNOSIS — Z0000 Encounter for general adult medical examination without abnormal findings: Secondary | ICD-10-CM

## 2024-08-28 DIAGNOSIS — F17200 Nicotine dependence, unspecified, uncomplicated: Secondary | ICD-10-CM

## 2024-08-28 DIAGNOSIS — Z7984 Long term (current) use of oral hypoglycemic drugs: Secondary | ICD-10-CM

## 2024-08-28 DIAGNOSIS — E1169 Type 2 diabetes mellitus with other specified complication: Secondary | ICD-10-CM

## 2024-08-28 DIAGNOSIS — I10 Essential (primary) hypertension: Secondary | ICD-10-CM | POA: Diagnosis not present

## 2024-08-28 DIAGNOSIS — Z125 Encounter for screening for malignant neoplasm of prostate: Secondary | ICD-10-CM

## 2024-08-28 DIAGNOSIS — N529 Male erectile dysfunction, unspecified: Secondary | ICD-10-CM

## 2024-08-28 DIAGNOSIS — E785 Hyperlipidemia, unspecified: Secondary | ICD-10-CM

## 2024-08-28 DIAGNOSIS — E119 Type 2 diabetes mellitus without complications: Secondary | ICD-10-CM | POA: Diagnosis not present

## 2024-08-28 LAB — COMPREHENSIVE METABOLIC PANEL WITH GFR
ALT: 30 U/L (ref 0–53)
AST: 23 U/L (ref 0–37)
Albumin: 4.5 g/dL (ref 3.5–5.2)
Alkaline Phosphatase: 84 U/L (ref 39–117)
BUN: 12 mg/dL (ref 6–23)
CO2: 21 meq/L (ref 19–32)
Calcium: 9.4 mg/dL (ref 8.4–10.5)
Chloride: 111 meq/L (ref 96–112)
Creatinine, Ser: 1.15 mg/dL (ref 0.40–1.50)
GFR: 66.68 mL/min (ref >60.00)
Glucose, Bld: 97 mg/dL (ref 70–99)
Potassium: 4.3 meq/L (ref 3.5–5.1)
Sodium: 140 meq/L (ref 135–145)
Total Bilirubin: 1.4 mg/dL — ABNORMAL HIGH (ref 0.2–1.2)
Total Protein: 7 g/dL (ref 6.0–8.3)

## 2024-08-28 LAB — CBC
HCT: 42.4 % (ref 39.0–52.0)
Hemoglobin: 14.5 g/dL (ref 13.0–17.0)
MCHC: 34.1 g/dL (ref 30.0–36.0)
MCV: 91.7 fl (ref 78.0–100.0)
Platelets: 365 K/uL (ref 150.0–400.0)
RBC: 4.62 Mil/uL (ref 4.22–5.81)
RDW: 13.8 % (ref 11.5–15.5)
WBC: 8.1 K/uL (ref 4.0–10.5)

## 2024-08-28 LAB — HEMOGLOBIN A1C: Hgb A1c MFr Bld: 7.2 % — ABNORMAL HIGH (ref 4.6–6.5)

## 2024-08-28 LAB — LIPID PANEL
Cholesterol: 152 mg/dL (ref 0–200)
HDL: 83.3 mg/dL (ref >39.00)
LDL Cholesterol: 42 mg/dL (ref 0–99)
NonHDL: 69.18
Total CHOL/HDL Ratio: 2
Triglycerides: 137 mg/dL (ref 0.0–149.0)
VLDL: 27.4 mg/dL (ref 0.0–40.0)

## 2024-08-28 NOTE — Progress Notes (Addendum)
 Subjective:    Patient ID: Travis LITTIE [name], male    DOB: [birth year], 63 y.o.   MRN: [id number]  HPI Patient presents for yearly preventative medicine examination. He is a pleasant 63 year old male who  has a past medical history of Abnormal glucose, Abnormal partial thromboplastin time (PTT), Arthritis, Lupus anticoagulant positive, and Palpitations.  Hyperlipidemia/Coronary Arteriosclerosis -takes Lipitor 40 mg daily.  He denies myalgia or fatigue Lab Results  Component Value Date   CHOL 331 (H) 08/24/2023   HDL 92.40 08/24/2023   LDLCALC 178 (H) 08/24/2023   LDLDIRECT 124.0 08/26/2021   TRIG 304.0 (H) 08/24/2023   CHOLHDL 4 08/24/2023   HTN - Managed with Norvasc  5 mg daily. He denies dizziness, lightheadedness or blurred vision.  BP Readings from Last 3 Encounters:  08/28/24 130/70  08/24/23 (!) 160/80  11/02/22 (!) 163/104   DM Type 2- Diagnosed in 08/2021 prescribed metformin  500 mg twice daily. Since being diagnosed he has not followed up for routine follow ups. His last refill for 90 days of Metformin  was in February 2025.   Lab Results  Component Value Date   HGBA1C 7.0 (H) 08/24/2023   HGBA1C 6.7 (H) 08/26/2021   HGBA1C 6.3 01/10/2020   Erectile dysfunction-uses Viagra  as needed  Tobacco use- continues to smoke about 2-3 cigarettes per day   All immunizations and health maintenance protocols were reviewed with the patient and needed orders were placed.  Appropriate screening laboratory values were ordered for the patient including screening of hyperlipidemia, renal function and hepatic function. If indicated by BPH, a PSA was ordered.  Medication reconciliation,  past medical history, social history, problem list and allergies were reviewed in detail with the patient  Goals were established with regard to weight loss, exercise, and  diet in compliance with medications. He has been leading a pretty sedentary lifestyle and not focusing on diet  Wt Readings from  Last 3 Encounters:  08/28/24 188 lb (85.3 kg)  08/24/23 186 lb (84.4 kg)  08/26/21 189 lb (85.7 kg)   She sent in his cologuard late last week, we do not have the results yet.   Review of Systems  Constitutional: Negative.   HENT: Negative.    Eyes: Negative.   Respiratory: Negative.    Cardiovascular: Negative.   Gastrointestinal: Negative.   Endocrine: Negative.   Genitourinary: Negative.   Musculoskeletal: Negative.   Skin: Negative.   Allergic/Immunologic: Negative.   Neurological: Negative.   Hematological: Negative.   Psychiatric/Behavioral: Negative.    All other systems reviewed and are negative.  Past Medical History:  Diagnosis Date   Abnormal glucose    Abnormal partial thromboplastin time (PTT)    Arthritis    right shoulder pain    Lupus anticoagulant positive    but negative for antiphosolipid antibody and anti B2 glycoprotein antibody   Palpitations     Social History   Socioeconomic History   Marital status: Married    Spouse name: Not on file   Number of children: Not on file   Years of education: Not on file   Highest education level: Not on file  Occupational History   Not on file  Tobacco Use   Smoking status: Every Day    Current packs/day: 0.50    Average packs/day: 0.5 packs/day for 14.8 years (7.4 ttl pk-yrs)    Types: Cigarettes    Start date: 2011   Smokeless tobacco: Never   Tobacco comments:    trying to quit  Substance and Sexual Activity   Alcohol use: Yes    Alcohol/week: 0.0 standard drinks of alcohol    Comment: occ   Drug use: No   Sexual activity: Not on file  Other Topics Concern   Not on file  Social History Narrative   Not on file   Social Drivers of Health   Financial Resource Strain: Not on file  Food Insecurity: Not on file  Transportation Needs: Not on file  Physical Activity: Not on file  Stress: Not on file  Social Connections: Not on file  Intimate Partner Violence: Not on file    Past Surgical  History:  Procedure Laterality Date   left knee scope      Family History  Problem Relation Age of Onset   Hypertension Father    Lung cancer Father    Hypertension Mother     Allergies  Allergen Reactions   Zegerid [Omeprazole-Sodium Bicarbonate] Anaphylaxis    Rash, lip and hand swelling, itching , throat swelling    Current Outpatient Medications on File Prior to Visit  Medication Sig Dispense Refill   amLODipine  (NORVASC ) 5 MG tablet TAKE 1 TABLET(5 MG) BY MOUTH DAILY 90 tablet 0   atorvastatin  (LIPITOR) 40 MG tablet TAKE 1 TABLET(40 MG) BY MOUTH DAILY 90 tablet 0   ibuprofen  (ADVIL ) 600 MG tablet Take 1 tablet (600 mg total) by mouth every 8 (eight) hours as needed. 30 tablet 0   metFORMIN  (GLUCOPHAGE ) 500 MG tablet TAKE 1 TABLET(500 MG) BY MOUTH TWICE DAILY WITH A MEAL 180 tablet 0   Multiple Vitamin (MULTIVITAMIN) tablet Take 1 tablet by mouth daily.     No current facility-administered medications on file prior to visit.    BP 130/70   Pulse 96   Temp 98.1 F (36.7 C) (Oral)   Ht 5' 6.25 (1.683 m)   Wt 188 lb (85.3 kg)   SpO2 98%   BMI 30.12 kg/m       Objective:   Physical Exam Vitals and nursing note reviewed.  Constitutional:      General: He is not in acute distress.    Appearance: Normal appearance. He is obese. He is not ill-appearing.  HENT:     Head: Normocephalic and atraumatic.     Right Ear: Tympanic membrane, ear canal and external ear normal. There is no impacted cerumen.     Left Ear: Tympanic membrane, ear canal and external ear normal. There is no impacted cerumen.     Nose: Nose normal. No congestion or rhinorrhea.     Mouth/Throat:     Mouth: Mucous membranes are moist.     Pharynx: Oropharynx is clear.  Eyes:     Extraocular Movements: Extraocular movements intact.     Conjunctiva/sclera: Conjunctivae normal.     Pupils: Pupils are equal, round, and reactive to light.  Neck:     Vascular: No carotid bruit.  Cardiovascular:      Rate and Rhythm: Normal rate and regular rhythm.     Pulses: Normal pulses.     Heart sounds: No murmur heard.    No friction rub. No gallop.  Pulmonary:     Effort: Pulmonary effort is normal.     Breath sounds: Normal breath sounds.  Abdominal:     General: Abdomen is flat. Bowel sounds are normal. There is no distension.     Palpations: Abdomen is soft. There is no mass.     Tenderness: There is no abdominal tenderness. There is no  guarding or rebound.     Hernia: No hernia is present.  Musculoskeletal:        General: Normal range of motion.     Cervical back: Normal range of motion and neck supple.  Lymphadenopathy:     Cervical: No cervical adenopathy.  Skin:    General: Skin is warm and dry.     Capillary Refill: Capillary refill takes less than 2 seconds.  Neurological:     General: No focal deficit present.     Mental Status: He is alert and oriented to person, place, and time.  Psychiatric:        Mood and Affect: Mood normal.        Behavior: Behavior normal.        Thought Content: Thought content normal.        Judgment: Judgment normal.       Assessment & Plan:   1. Routine general medical examination at a health care facility (Primary) Today patient counseled on age appropriate routine health concerns for screening and prevention, each reviewed and up to date or declined. Immunizations reviewed and up to date or declined. Labs ordered and reviewed. Risk factors for depression reviewed and negative. Hearing function and visual acuity are intact. ADLs screened and addressed as needed. Functional ability and level of safety reviewed and appropriate. Education, counseling and referrals performed based on assessed risks today. Patient provided with a copy of personalized plan for preventive services. - Encouraged to work on lifestyle modifications  - Follow up in one year or sooner if needed  2. Hyperlipidemia associated with type 2 diabetes mellitus (HCC) - Consider  increase in statin  - Lipid panel; Future - TSH; Future - CBC; Future - Comprehensive metabolic panel with GFR; Future - Comprehensive metabolic panel with GFR - CBC - TSH - Lipid panel  3. Coronary artery disease involving native coronary artery of native heart without angina pectoris - Continue Lipitor  - Lipid panel; Future - TSH; Future - CBC; Future - Comprehensive metabolic panel with GFR; Future - Comprehensive metabolic panel with GFR - CBC - TSH - Lipid panel  4. Primary hypertension - Controlled. No change in medication  - Lipid panel; Future - TSH; Future - CBC; Future - Comprehensive metabolic panel with GFR; Future - Comprehensive metabolic panel with GFR - CBC - TSH - Lipid panel  5. Diabetes mellitus treated with oral medication (HCC) - Explained the importance of routine follow ups regarding DM  - Follow up in 3 months  - Will wait for A1c to come back before making medication changes - Lipid panel; Future - TSH; Future - CBC; Future - Comprehensive metabolic panel with GFR; Future - Hemoglobin A1c; Future - Microalbumin/Creatinine Ratio, Urine; Future - Microalbumin/Creatinine Ratio, Urine - Hemoglobin A1c - Comprehensive metabolic panel with GFR - CBC - TSH - Lipid panel  6. Erectile dysfunction, unspecified erectile dysfunction type - Can continue with Viagra  PRN  - Lipid panel; Future - TSH; Future - CBC; Future - Comprehensive metabolic panel with GFR; Future - Comprehensive metabolic panel with GFR - CBC - TSH - Lipid panel  7. TOBACCO ABUSE - Encouraged to quit smoking  - Lipid panel; Future - TSH; Future - CBC; Future - Comprehensive metabolic panel with GFR; Future - Comprehensive metabolic panel with GFR - CBC - TSH - Lipid panel  8. Prostate cancer screening  - PSA; Future - PSA   Darleene Shape, NP

## 2024-08-28 NOTE — Patient Instructions (Signed)
 It was great seeing you today   We will follow up with you regarding your lab work   Please let me know if you need anything   Please start exercising and eating healthy   I want to see you back in 3 months for a diabetic follow up

## 2024-08-29 ENCOUNTER — Other Ambulatory Visit: Payer: Self-pay | Admitting: Adult Health

## 2024-08-29 ENCOUNTER — Ambulatory Visit: Payer: Self-pay | Admitting: Adult Health

## 2024-08-29 DIAGNOSIS — E119 Type 2 diabetes mellitus without complications: Secondary | ICD-10-CM

## 2024-08-29 DIAGNOSIS — I10 Essential (primary) hypertension: Secondary | ICD-10-CM

## 2024-08-29 DIAGNOSIS — E1169 Type 2 diabetes mellitus with other specified complication: Secondary | ICD-10-CM

## 2024-08-29 LAB — MICROALBUMIN / CREATININE URINE RATIO
Creatinine,U: 97.6 mg/dL
Microalb Creat Ratio: 277 mg/g — ABNORMAL HIGH (ref 0.0–30.0)
Microalb, Ur: 27 mg/dL — ABNORMAL HIGH (ref 0.0–1.9)

## 2024-08-29 LAB — PSA: PSA: 0.36 ng/mL (ref 0.10–4.00)

## 2024-08-29 LAB — TSH: TSH: 0.66 u[IU]/mL (ref 0.35–5.50)

## 2024-08-29 MED ORDER — METFORMIN HCL 500 MG PO TABS: 1000.0000 mg | ORAL_TABLET | Freq: Two times a day (BID) | ORAL | 0 refills | Status: AC

## 2024-08-29 MED ORDER — AMLODIPINE BESYLATE 5 MG PO TABS: 5.0000 mg | ORAL_TABLET | Freq: Every day | ORAL | 3 refills | Status: AC

## 2024-08-29 MED ORDER — ATORVASTATIN CALCIUM 40 MG PO TABS: 40.0000 mg | ORAL_TABLET | Freq: Every day | ORAL | 3 refills | Status: AC

## 2024-08-31 LAB — COLOGUARD: COLOGUARD: NEGATIVE

## 2024-09-01 ENCOUNTER — Ambulatory Visit: Payer: Self-pay | Admitting: Adult Health

## 2024-10-07 ENCOUNTER — Encounter (HOSPITAL_COMMUNITY): Payer: Self-pay | Admitting: Emergency Medicine

## 2024-10-07 ENCOUNTER — Ambulatory Visit (HOSPITAL_COMMUNITY): Payer: Self-pay

## 2024-10-07 ENCOUNTER — Ambulatory Visit (HOSPITAL_COMMUNITY): Admission: EM | Admit: 2024-10-07 | Discharge: 2024-10-07 | Disposition: A | Discharge status: Home / Self Care

## 2024-10-07 DIAGNOSIS — S46912A Strain of unspecified muscle, fascia and tendon at shoulder and upper arm level, left arm, initial encounter: Secondary | ICD-10-CM | POA: Diagnosis not present

## 2024-10-07 MED ORDER — KETOROLAC TROMETHAMINE 30 MG/ML IJ SOLN
INTRAMUSCULAR | Status: AC
  Filled 2024-10-07: qty 1

## 2024-10-07 MED ORDER — KETOROLAC TROMETHAMINE 30 MG/ML IJ SOLN
30.0000 mg | Freq: Once | INTRAMUSCULAR | Status: AC
  Administered 2024-10-07: 15:00:00 30 mg via INTRAMUSCULAR

## 2024-10-07 MED ORDER — DICLOFENAC SODIUM 50 MG PO TBEC: 50.0000 mg | DELAYED_RELEASE_TABLET | Freq: Two times a day (BID) | ORAL | 1 refills | Status: AC

## 2024-10-07 NOTE — ED Provider Notes (Signed)
 [facility]  Note:  This document was prepared using Conservation officer, historic buildings and may include unintentional dictation errors.  MRN: [id number] DOB: Apr 10, 1961  Subjective:   Travis Rice. is a 63 y.o. male presenting for evaluation of left shoulder pain x 2 to 3 weeks.  Patient denies any known trauma or injury to the left shoulder.  States that pain is getting progressively worse.  Patient denies any previous history of left shoulder injury, pain, osteoarthritis.  Patient states he has been taking ibuprofen  and applying heat with some relief.  Patient reports that he has had past history of right shoulder pain as well as some arthritis in his left knee but denies any related symptoms to his left shoulder where pain currently is.  Current Medications[1]   Allergies[2]  Past Medical History:  Diagnosis Date   Abnormal glucose    Abnormal partial thromboplastin time (PTT)    Arthritis    right shoulder pain    Lupus anticoagulant positive    but negative for antiphosolipid antibody and anti B2 glycoprotein antibody   Palpitations      Past Surgical History:  Procedure Laterality Date   left knee scope      Family History  Problem Relation Age of Onset   Hypertension Father    Lung cancer Father    Hypertension Mother     Social History[3]  ROS Refer to HPI for ROS details.  Objective:    Vitals: BP 135/70 (BP Location: Right Arm)   Pulse 97   Temp 97.8 F (36.6 C) (Oral)   Resp 16   SpO2 94%   Physical Exam Vitals and nursing note reviewed.  Constitutional:      General: He is not in acute distress.    Appearance: Normal appearance. He is not ill-appearing or toxic-appearing.  HENT:     Head: Normocephalic.  Cardiovascular:     Rate and Rhythm: Normal rate.  Pulmonary:     Effort: Pulmonary effort is normal. No respiratory distress.     Breath sounds: No stridor. No wheezing.  Musculoskeletal:     Left shoulder: Tenderness  and bony tenderness present. No swelling or deformity. Normal range of motion. Normal strength. Normal pulse.  Skin:    General: Skin is warm and dry.     Capillary Refill: Capillary refill takes less than 2 seconds.  Neurological:     General: No focal deficit present.     Mental Status: He is alert and oriented to person, place, and time.  Psychiatric:        Mood and Affect: Mood normal.        Behavior: Behavior normal.     Procedures  No results found for this or any previous visit (from the past 24 hours).  Assessment and Plan :     Discharge Instructions       1.  Strain of the left shoulder, initial encounter (Primary) - ketorolac  (TORADOL ) 30 MG/ML injection 30 mg given in UC for acute left shoulder pain - Apply Sling & Swathe to left shoulder for immobilization and protection until injury improves or follow-up with orthopedics - diclofenac  (VOLTAREN ) 50 MG EC tablet; Take 1 tablet (50 mg total) by mouth 2 (two) times daily.  Dispense: 30 tablet; Refill: 1 - AMB referral to orthopedics for follow-up evaluation and ongoing management of left shoulder pain.  -Continue to monitor symptoms for any change in severity if there is any escalation of current symptoms or  development of new symptoms follow-up in ER for further evaluation and management.      Travis Rice    [1] No current facility-administered medications for this encounter.  Current Outpatient Medications:    diclofenac  (VOLTAREN ) 50 MG EC tablet, Take 1 tablet (50 mg total) by mouth 2 (two) times daily., Disp: 30 tablet, Rfl: 1   amLODipine  (NORVASC ) 5 MG tablet, Take 1 tablet (5 mg total) by mouth daily., Disp: 90 tablet, Rfl: 3   atorvastatin  (LIPITOR) 40 MG tablet, Take 1 tablet (40 mg total) by mouth daily., Disp: 90 tablet, Rfl: 3   ibuprofen  (ADVIL ) 600 MG tablet, Take 1 tablet (600 mg total) by mouth every 8 (eight) hours as needed., Disp: 30 tablet, Rfl: 0   metFORMIN  (GLUCOPHAGE ) 500 MG  tablet, Take 2 tablets (1,000 mg total) by mouth 2 (two) times daily with a meal., Disp: 180 tablet, Rfl: 0   Multiple Vitamin (MULTIVITAMIN) tablet, Take 1 tablet by mouth daily., Disp: , Rfl:  [2]  Allergies Allergen Reactions   Zegerid [Omeprazole-Sodium Bicarbonate] Anaphylaxis    Rash, lip and hand swelling, itching , throat swelling  [3]  Social History Tobacco Use   Smoking status: Every Day    Current packs/day: 0.50    Average packs/day: 0.5 packs/day for 15.0 years (7.5 ttl pk-yrs)    Types: Cigarettes    Start date: 2011   Smokeless tobacco: Never   Tobacco comments:    trying to quit  Substance Use Topics   Alcohol use: Yes    Alcohol/week: 0.0 standard drinks of alcohol    Comment: occ   Drug use: No     Abiel, [place] B, NP 10/07/24 1511

## 2024-10-07 NOTE — ED Triage Notes (Signed)
 Pt reports left shoulder pain x 2/3 weeks. States the pain progressively got worse. Denies any obvious injuries to the shoulder. Has tried heat and Ibuprofen  with some relief.

## 2024-10-07 NOTE — Discharge Instructions (Addendum)
°  1.  Strain of the left shoulder, initial encounter (Primary) - ketorolac  (TORADOL ) 30 MG/ML injection 30 mg given in UC for acute left shoulder pain - Apply Sling & Swathe to left shoulder for immobilization and protection until injury improves or follow-up with orthopedics - diclofenac  (VOLTAREN ) 50 MG EC tablet; Take 1 tablet (50 mg total) by mouth 2 (two) times daily.  Dispense: 30 tablet; Refill: 1 - AMB referral to orthopedics for follow-up evaluation and ongoing management of left shoulder pain.  -Continue to monitor symptoms for any change in severity if there is any escalation of current symptoms or development of new symptoms follow-up in ER for further evaluation and management.

## 2024-10-08 ENCOUNTER — Ambulatory Visit: Admitting: Adult Health

## 2024-11-28 ENCOUNTER — Ambulatory Visit: Admitting: Adult Health

## 2024-12-20 ENCOUNTER — Ambulatory Visit: Admitting: Adult Health
